# Patient Record
Sex: Female | Born: 1960 | Race: White | Hispanic: No | Marital: Married | Smoking: Never smoker
Health system: Southern US, Community
[De-identification: ages and names within clinical notes are randomized; demographics above are authoritative.]

## PROBLEM LIST (undated history)

## (undated) DIAGNOSIS — Z96659 Presence of unspecified artificial knee joint: Secondary | ICD-10-CM

## (undated) DIAGNOSIS — Z9289 Personal history of other medical treatment: Secondary | ICD-10-CM

## (undated) DIAGNOSIS — J45909 Unspecified asthma, uncomplicated: Secondary | ICD-10-CM

## (undated) DIAGNOSIS — M79605 Pain in left leg: Secondary | ICD-10-CM

## (undated) DIAGNOSIS — K219 Gastro-esophageal reflux disease without esophagitis: Secondary | ICD-10-CM

## (undated) DIAGNOSIS — M1990 Unspecified osteoarthritis, unspecified site: Secondary | ICD-10-CM

## (undated) DIAGNOSIS — E119 Type 2 diabetes mellitus without complications: Secondary | ICD-10-CM

## (undated) DIAGNOSIS — M1711 Unilateral primary osteoarthritis, right knee: Secondary | ICD-10-CM

## (undated) DIAGNOSIS — M545 Low back pain: Secondary | ICD-10-CM

## (undated) HISTORY — PX: APPENDECTOMY: SHX54

## (undated) HISTORY — PX: UPPER GI ENDOSCOPY: SHX6162

## (undated) HISTORY — PX: BREAST SURGERY: SHX581

## (undated) HISTORY — PX: COLONOSCOPY: SHX174

---

## 2004-12-31 ENCOUNTER — Ambulatory Visit: Payer: Self-pay | Admitting: Family Medicine

## 2006-11-18 ENCOUNTER — Ambulatory Visit: Payer: Self-pay | Admitting: Family Medicine

## 2008-06-12 ENCOUNTER — Ambulatory Visit: Payer: Self-pay | Admitting: Family Medicine

## 2008-06-25 ENCOUNTER — Ambulatory Visit: Payer: Self-pay | Admitting: Family Medicine

## 2009-05-24 HISTORY — PX: KNEE ARTHROSCOPY W/ MENISCECTOMY: SHX1879

## 2009-09-25 ENCOUNTER — Ambulatory Visit: Payer: Self-pay | Admitting: Family Medicine

## 2010-10-08 ENCOUNTER — Ambulatory Visit: Payer: Self-pay | Admitting: General Surgery

## 2010-10-23 ENCOUNTER — Ambulatory Visit: Payer: Self-pay | Admitting: General Surgery

## 2010-11-25 ENCOUNTER — Ambulatory Visit: Payer: Self-pay | Admitting: Gastroenterology

## 2010-11-27 LAB — PATHOLOGY REPORT

## 2011-06-09 HISTORY — PX: BREAST BIOPSY: SHX20

## 2012-06-27 ENCOUNTER — Ambulatory Visit: Payer: Self-pay | Admitting: Gastroenterology

## 2012-11-08 ENCOUNTER — Ambulatory Visit: Payer: Self-pay | Admitting: Family Medicine

## 2013-11-02 ENCOUNTER — Ambulatory Visit: Payer: Self-pay | Admitting: Family Medicine

## 2014-05-08 ENCOUNTER — Ambulatory Visit: Payer: Self-pay | Admitting: Orthopedic Surgery

## 2014-05-08 DIAGNOSIS — M545 Low back pain, unspecified: Secondary | ICD-10-CM | POA: Diagnosis present

## 2014-05-08 DIAGNOSIS — M79605 Pain in left leg: Secondary | ICD-10-CM

## 2014-05-08 HISTORY — DX: Low back pain, unspecified: M54.50

## 2014-05-08 HISTORY — DX: Pain in left leg: M79.605

## 2014-08-08 ENCOUNTER — Encounter (HOSPITAL_COMMUNITY): Payer: Self-pay | Admitting: Physician Assistant

## 2014-08-08 DIAGNOSIS — K219 Gastro-esophageal reflux disease without esophagitis: Secondary | ICD-10-CM | POA: Diagnosis present

## 2014-08-08 DIAGNOSIS — J45909 Unspecified asthma, uncomplicated: Secondary | ICD-10-CM | POA: Diagnosis present

## 2014-08-08 DIAGNOSIS — M1712 Unilateral primary osteoarthritis, left knee: Secondary | ICD-10-CM | POA: Diagnosis present

## 2014-08-08 DIAGNOSIS — M1990 Unspecified osteoarthritis, unspecified site: Secondary | ICD-10-CM | POA: Diagnosis present

## 2014-08-08 NOTE — H&P (Addendum)
TOTAL KNEE ADMISSION H&P  Patient is being admitted for left total knee arthroplasty.  Subjective:  Chief Complaint:left knee pain.  HPI: Evelyn Mcintosh, 54 y.o. female, has a history of pain and functional disability in the left knee due to arthritis and has failed non-surgical conservative treatments for greater than 12 weeks to includeNSAID's and/or analgesics, corticosteriod injections, viscosupplementation injections, flexibility and strengthening excercises, supervised PT with diminished ADL's post treatment, use of assistive devices and activity modification.  Onset of symptoms was gradual, starting 10 years ago with gradually worsening course since that time. The patient noted prior procedures on the knee to include  arthroscopy and menisectomy on the left knee(s).  Patient currently rates pain in the left knee(s) at 10 out of 10 with activity. Patient has night pain, worsening of pain with activity and weight bearing, pain that interferes with activities of daily living, crepitus and joint swelling.  Patient has evidence of subchondral sclerosis, periarticular osteophytes and joint space narrowing by imaging studies.There is no active infection.  Patient Active Problem List   Diagnosis Date Noted  . Primary localized osteoarthritis of left knee 08/08/2014  . Asthma   . GERD (gastroesophageal reflux disease)   . Arthritis    Past Medical History  Diagnosis Date  . Asthma   . GERD (gastroesophageal reflux disease)   . Arthritis     Past Surgical History  Procedure Laterality Date  . Knee arthroscopy w/ meniscectomy Left 05/24/2009     No current facility-administered medications for this encounter.  Current outpatient prescriptions:  .  omeprazole (PRILOSEC) 40 MG capsule, Take 40 mg by mouth daily., Disp: , Rfl:   Allergies no known allergies  History  Substance Use Topics  . Smoking status: Never Smoker   . Smokeless tobacco: Not on file  . Alcohol Use: No    Family  History  Problem Relation Age of Onset  . Arthritis    . Diabetes    . Hypertension Father   . Stroke Father   . Atrial fibrillation Mother      Review of Systems  Constitutional: Negative.   HENT: Negative.   Eyes: Negative.   Respiratory: Negative.   Cardiovascular: Negative.   Gastrointestinal: Negative.   Genitourinary: Negative.   Musculoskeletal: Positive for joint pain.       Left knee pain  Skin: Negative.   Neurological: Negative.   Endo/Heme/Allergies: Negative.   Psychiatric/Behavioral: Negative.     Objective:  Physical Exam  Constitutional: She is oriented to person, place, and time. She appears well-developed and well-nourished.  HENT:  Head: Normocephalic and atraumatic.  Mouth/Throat: Oropharynx is clear and moist.  Eyes: Conjunctivae and EOM are normal. Pupils are equal, round, and reactive to light.  Neck: Neck supple.  Cardiovascular: Normal rate, regular rhythm and normal heart sounds.   Respiratory: Effort normal and breath sounds normal.  GI: Soft. Bowel sounds are normal.  Genitourinary:  Not pertinent to current symptomatology therefore not examined.  Musculoskeletal:  Examination of her left knee reveals pain medially and laterally, 1+ crepitation 1+ synovitis, range of motion -5 to 115 degrees knee is stable with normal patella tracking. Exam of the right knee reveals full range of motion without pain swelling weakness or instability. Vascular exam: pulses 2+ and symmetric.  Neurological: She is alert and oriented to person, place, and time.  Skin: Skin is warm and dry.  Psychiatric: She has a normal mood and affect. Her behavior is normal.    Vital signs  in last 24 hours: Temp:  [98.2 F (36.8 C)] 98.2 F (36.8 C) (03/02 1300) Pulse Rate:  [70] 70 (03/02 1300) BP: (155)/(78) 155/78 mmHg (03/02 1300) SpO2:  [100 %] 100 % (03/02 1300) Weight:  [76.658 kg (169 lb)] 76.658 kg (169 lb) (03/02 1300)  Labs:   Estimated body mass index is  29.94 kg/(m^2) as calculated from the following:   Height as of this encounter: 5\' 3"  (1.6 m).   Weight as of this encounter: 76.658 kg (169 lb).   Imaging Review Plain radiographs demonstrate severe degenerative joint disease of the left knee(s). The overall alignment ismild valgus. The bone quality appears to be good for age and reported activity level.  Assessment/Plan:  End stage arthritis, left knee Principal Problem:   Primary localized osteoarthritis of left knee Active Problems:   Asthma   GERD (gastroesophageal reflux disease)   Arthritis   Lumbar pain with radiation down left leg    The patient history, physical examination, clinical judgment of the provider and imaging studies are consistent with end stage degenerative joint disease of the left knee(s) and total knee arthroplasty is deemed medically necessary. The treatment options including medical management, injection therapy arthroscopy and arthroplasty were discussed at length. The risks and benefits of total knee arthroplasty were presented and reviewed. The risks due to aseptic loosening, infection, stiffness, patella tracking problems, thromboembolic complications and other imponderables were discussed. The patient acknowledged the explanation, agreed to proceed with the plan and consent was signed. Patient is being admitted for inpatient treatment for surgery, pain control, PT, OT, prophylactic antibiotics, VTE prophylaxis, progressive ambulation and ADL's and discharge planning. 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  She has started her mupirocin.  Bascom Biel A. Gwinda PasseShepperson, PA-C Physician Assistant [facility] [phone]  08/08/2014, 2:51 PM

## 2014-08-09 NOTE — Pre-Procedure Instructions (Signed)
Evelyn Mcintosh  08/09/2014   Your procedure is scheduled on:  Monday, March 14th   [facility].  Call this number if you have problems the morning of surgery: [phone]   Remember:   Do not eat food or drink liquids after midnight Sunday.   Take these medicines the morning of surgery with A SIP OF WATER: Omeprazole   Do not wear jewelry, make-up or nail polish.  Do not wear lotions, powders, or perfumes. You may NOT wear deodorant the day of surgery.  Do not shave underarms & legs 48 hours prior to surgery.    Do not bring valuables to the hospital.  [facility] is not responsible for any belongings or valuables.               Contacts, dentures or bridgework may not be worn into surgery.  Leave suitcase in the car. After surgery it may be brought to your room.  For patients admitted to the hospital, discharge time is determined by your treatment team.    Name and phone number of your driver:    Special Instructions: "Preparing for Surgery" instruction sheet.   Please read over the following fact sheets that you were given: Pain Booklet, Coughing and Deep Breathing, Blood Transfusion Information, MRSA Information and Surgical Site Infection Prevention

## 2014-08-10 ENCOUNTER — Encounter (HOSPITAL_COMMUNITY)
Admission: RE | Admit: 2014-08-10 | Discharge: 2014-08-10 | Disposition: A | Discharge status: Home / Self Care | Payer: BLUE CROSS/BLUE SHIELD | Source: Ambulatory Visit | Attending: Orthopedic Surgery | Admitting: Orthopedic Surgery

## 2014-08-10 ENCOUNTER — Encounter (HOSPITAL_COMMUNITY): Payer: Self-pay

## 2014-08-10 DIAGNOSIS — J45909 Unspecified asthma, uncomplicated: Secondary | ICD-10-CM | POA: Insufficient documentation

## 2014-08-10 DIAGNOSIS — Z01818 Encounter for other preprocedural examination: Secondary | ICD-10-CM | POA: Diagnosis present

## 2014-08-10 DIAGNOSIS — Z01812 Encounter for preprocedural laboratory examination: Secondary | ICD-10-CM | POA: Diagnosis not present

## 2014-08-10 DIAGNOSIS — Z0183 Encounter for blood typing: Secondary | ICD-10-CM | POA: Diagnosis not present

## 2014-08-10 DIAGNOSIS — K219 Gastro-esophageal reflux disease without esophagitis: Secondary | ICD-10-CM | POA: Insufficient documentation

## 2014-08-10 DIAGNOSIS — M1712 Unilateral primary osteoarthritis, left knee: Secondary | ICD-10-CM | POA: Diagnosis not present

## 2014-08-10 LAB — CBC WITH DIFFERENTIAL/PLATELET
Basophils Absolute: 0 10*3/uL (ref 0.0–0.1)
Basophils Relative: 0 % (ref 0–1)
EOS ABS: 0.2 10*3/uL (ref 0.0–0.7)
EOS PCT: 5 % (ref 0–5)
HCT: 40.4 % (ref 36.0–46.0)
Hemoglobin: 13.6 g/dL (ref 12.0–15.0)
LYMPHS ABS: 1.4 10*3/uL (ref 0.7–4.0)
LYMPHS PCT: 31 % (ref 12–46)
MCH: 29.5 pg (ref 26.0–34.0)
MCHC: 33.7 g/dL (ref 30.0–36.0)
MCV: 87.6 fL (ref 78.0–100.0)
Monocytes Absolute: 0.4 10*3/uL (ref 0.1–1.0)
Monocytes Relative: 10 % (ref 3–12)
NEUTROS PCT: 54 % (ref 43–77)
Neutro Abs: 2.4 10*3/uL (ref 1.7–7.7)
PLATELETS: 346 10*3/uL (ref 150–400)
RBC: 4.61 MIL/uL (ref 3.87–5.11)
RDW: 13 % (ref 11.5–15.5)
WBC: 4.5 10*3/uL (ref 4.0–10.5)

## 2014-08-10 LAB — COMPREHENSIVE METABOLIC PANEL
ALT: 16 U/L (ref 0–35)
AST: 17 U/L (ref 0–37)
Albumin: 4 g/dL (ref 3.5–5.2)
Alkaline Phosphatase: 55 U/L (ref 39–117)
Anion gap: 7 (ref 5–15)
BUN: 12 mg/dL (ref 6–23)
CHLORIDE: 106 mmol/L (ref 96–112)
CO2: 29 mmol/L (ref 19–32)
Calcium: 9.3 mg/dL (ref 8.4–10.5)
Creatinine, Ser: 0.82 mg/dL (ref 0.50–1.10)
GFR, EST NON AFRICAN AMERICAN: 80 mL/min — AB (ref >90)
GLUCOSE: 97 mg/dL (ref 70–99)
Potassium: 4 mmol/L (ref 3.5–5.1)
Sodium: 142 mmol/L (ref 135–145)
Total Bilirubin: 0.7 mg/dL (ref 0.3–1.2)
Total Protein: 6.3 g/dL (ref 6.0–8.3)

## 2014-08-10 LAB — URINALYSIS, ROUTINE W REFLEX MICROSCOPIC
Bilirubin Urine: NEGATIVE
Glucose, UA: NEGATIVE mg/dL
HGB URINE DIPSTICK: NEGATIVE
Ketones, ur: NEGATIVE mg/dL
Leukocytes, UA: NEGATIVE
NITRITE: NEGATIVE
Protein, ur: NEGATIVE mg/dL
SPECIFIC GRAVITY, URINE: 1.019 (ref 1.005–1.030)
Urobilinogen, UA: 0.2 mg/dL (ref 0.0–1.0)
pH: 7.5 (ref 5.0–8.0)

## 2014-08-10 LAB — ABO/RH: ABO/RH(D): A NEG

## 2014-08-10 LAB — SURGICAL PCR SCREEN
MRSA, PCR: NEGATIVE
Staphylococcus aureus: POSITIVE — AB

## 2014-08-10 LAB — TYPE AND SCREEN
ABO/RH(D): A NEG
Antibody Screen: NEGATIVE

## 2014-08-10 LAB — PROTIME-INR
INR: 1.01 (ref 0.00–1.49)
Prothrombin Time: 13.4 seconds (ref 11.6–15.2)

## 2014-08-10 LAB — PREGNANCY, URINE: PREG TEST UR: NEGATIVE

## 2014-08-10 LAB — APTT: APTT: 30 s (ref 24–37)

## 2014-08-10 NOTE — Progress Notes (Signed)
Have requested Jan. 2016 EKG from Dr. Hansel FeinsteinB. Ald[place].   [phone]

## 2014-08-12 LAB — URINE CULTURE
Colony Count: NO GROWTH
Culture: NO GROWTH

## 2014-08-19 MED ORDER — CEFAZOLIN SODIUM-DEXTROSE 2-3 GM-% IV SOLR
2.0000 g | INTRAVENOUS | Status: AC
  Administered 2014-08-20: 2 g via INTRAVENOUS
  Filled 2014-08-19: qty 50

## 2014-08-19 NOTE — Anesthesia Preprocedure Evaluation (Addendum)
Anesthesia Evaluation  Patient identified by MRN, date of birth, ID band Patient awake    Reviewed: Allergy & Precautions, NPO status , Patient's Chart, lab work & pertinent test results  Airway Mallampati: II       Dental  (+) Teeth Intact, Dental Advisory Given   Pulmonary asthma ,  breath sounds clear to auscultation        Cardiovascular negative cardio ROS  Rhythm:Regular     Neuro/Psych    GI/Hepatic Neg liver ROS, GERD-  Medicated,  Endo/Other  negative endocrine ROS  Renal/GU negative Renal ROS     Musculoskeletal   Abdominal (+) + obese,   Peds  Hematology 13/40   Anesthesia Other Findings   Reproductive/Obstetrics                           Anesthesia Physical Anesthesia Plan  ASA: II  Anesthesia Plan: General   Post-op Pain Management: MAC Combined w/ Regional for Post-op pain   Induction: Intravenous  Airway Management Planned: Oral ETT  Additional Equipment:   Intra-op Plan:   Post-operative Plan: Extubation in OR  Informed Consent: I have reviewed the patients History and Physical, chart, labs and discussed the procedure including the risks, benefits and alternatives for the proposed anesthesia with the patient or authorized representative who has indicated his/her understanding and acceptance.     Plan Discussed with:   Anesthesia Plan Comments:        Anesthesia Quick Evaluation

## 2014-08-20 ENCOUNTER — Inpatient Hospital Stay (HOSPITAL_COMMUNITY): Payer: BLUE CROSS/BLUE SHIELD | Admitting: Anesthesiology

## 2014-08-20 ENCOUNTER — Inpatient Hospital Stay (HOSPITAL_COMMUNITY)
Admission: RE | Admit: 2014-08-20 | Discharge: 2014-08-21 | DRG: 470 | Disposition: A | Discharge status: Home / Self Care | Payer: BLUE CROSS/BLUE SHIELD | Source: Ambulatory Visit | Attending: Orthopedic Surgery | Admitting: Orthopedic Surgery

## 2014-08-20 ENCOUNTER — Encounter (HOSPITAL_COMMUNITY)
Admission: RE | Disposition: A | Discharge status: Home / Self Care | Payer: Self-pay | Source: Ambulatory Visit | Attending: Orthopedic Surgery

## 2014-08-20 ENCOUNTER — Encounter (HOSPITAL_COMMUNITY): Payer: Self-pay | Admitting: Anesthesiology

## 2014-08-20 DIAGNOSIS — M179 Osteoarthritis of knee, unspecified: Secondary | ICD-10-CM | POA: Diagnosis present

## 2014-08-20 DIAGNOSIS — M1712 Unilateral primary osteoarthritis, left knee: Principal | ICD-10-CM | POA: Diagnosis present

## 2014-08-20 DIAGNOSIS — J45909 Unspecified asthma, uncomplicated: Secondary | ICD-10-CM | POA: Diagnosis present

## 2014-08-20 DIAGNOSIS — M1710 Unilateral primary osteoarthritis, unspecified knee: Secondary | ICD-10-CM | POA: Diagnosis present

## 2014-08-20 DIAGNOSIS — M1990 Unspecified osteoarthritis, unspecified site: Secondary | ICD-10-CM | POA: Diagnosis present

## 2014-08-20 DIAGNOSIS — M545 Low back pain, unspecified: Secondary | ICD-10-CM | POA: Diagnosis present

## 2014-08-20 DIAGNOSIS — Z96659 Presence of unspecified artificial knee joint: Secondary | ICD-10-CM

## 2014-08-20 DIAGNOSIS — K219 Gastro-esophageal reflux disease without esophagitis: Secondary | ICD-10-CM | POA: Diagnosis present

## 2014-08-20 DIAGNOSIS — M79605 Pain in left leg: Secondary | ICD-10-CM

## 2014-08-20 DIAGNOSIS — M25562 Pain in left knee: Secondary | ICD-10-CM | POA: Diagnosis present

## 2014-08-20 HISTORY — DX: Pain in left leg: M79.605

## 2014-08-20 HISTORY — DX: Unspecified asthma, uncomplicated: J45.909

## 2014-08-20 HISTORY — DX: Low back pain: M54.5

## 2014-08-20 HISTORY — DX: Presence of unspecified artificial knee joint: Z96.659

## 2014-08-20 HISTORY — DX: Gastro-esophageal reflux disease without esophagitis: K21.9

## 2014-08-20 HISTORY — DX: Unspecified osteoarthritis, unspecified site: M19.90

## 2014-08-20 HISTORY — PX: TOTAL KNEE ARTHROPLASTY: SHX125

## 2014-08-20 SURGERY — ARTHROPLASTY, KNEE, TOTAL
Anesthesia: General | Laterality: Left

## 2014-08-20 MED ORDER — LIDOCAINE HCL (CARDIAC) 20 MG/ML IV SOLN
INTRAVENOUS | Status: AC
  Filled 2014-08-20: qty 5

## 2014-08-20 MED ORDER — DEXAMETHASONE SODIUM PHOSPHATE 10 MG/ML IJ SOLN
10.0000 mg | Freq: Three times a day (TID) | INTRAMUSCULAR | Status: DC
  Administered 2014-08-20 – 2014-08-21 (×3): 10 mg via INTRAVENOUS
  Filled 2014-08-20 (×3): qty 1

## 2014-08-20 MED ORDER — CEFUROXIME SODIUM 1.5 G IJ SOLR
INTRAMUSCULAR | Status: AC
  Filled 2014-08-20: qty 1.5

## 2014-08-20 MED ORDER — FENTANYL CITRATE 0.05 MG/ML IJ SOLN
INTRAMUSCULAR | Status: AC
  Filled 2014-08-20: qty 5

## 2014-08-20 MED ORDER — POTASSIUM CHLORIDE IN NACL 20-0.9 MEQ/L-% IV SOLN
INTRAVENOUS | Status: DC
Start: 2014-08-20 — End: 2014-08-21
  Administered 2014-08-20: 100 mL/h via INTRAVENOUS
  Administered 2014-08-20: 15:00:00 via INTRAVENOUS
  Filled 2014-08-20 (×6): qty 1000

## 2014-08-20 MED ORDER — MIDAZOLAM HCL 2 MG/2ML IJ SOLN
INTRAMUSCULAR | Status: AC
  Filled 2014-08-20: qty 2

## 2014-08-20 MED ORDER — 0.9 % SODIUM CHLORIDE (POUR BTL) OPTIME
TOPICAL | Status: DC | PRN
  Administered 2014-08-20: 1000 mL

## 2014-08-20 MED ORDER — ROCURONIUM BROMIDE 100 MG/10ML IV SOLN
INTRAVENOUS | Status: DC | PRN
  Administered 2014-08-20: 40 mg via INTRAVENOUS

## 2014-08-20 MED ORDER — CEFUROXIME SODIUM 1.5 G IJ SOLR
INTRAMUSCULAR | Status: DC | PRN
  Administered 2014-08-20: 1.5 g

## 2014-08-20 MED ORDER — BUPIVACAINE-EPINEPHRINE (PF) 0.25% -1:200000 IJ SOLN
INTRAMUSCULAR | Status: AC
  Filled 2014-08-20: qty 30

## 2014-08-20 MED ORDER — PHENOL 1.4 % MT LIQD: 1.0000 | OROMUCOSAL | Status: DC | PRN

## 2014-08-20 MED ORDER — SODIUM CHLORIDE 0.9 % IJ SOLN
INTRAMUSCULAR | Status: AC
  Filled 2014-08-20: qty 10

## 2014-08-20 MED ORDER — MIDAZOLAM HCL 5 MG/5ML IJ SOLN
INTRAMUSCULAR | Status: DC | PRN
  Administered 2014-08-20: 2 mg via INTRAVENOUS

## 2014-08-20 MED ORDER — HYDROMORPHONE HCL 1 MG/ML IJ SOLN
0.5000 mg | INTRAMUSCULAR | Status: AC | PRN
  Administered 2014-08-20 (×4): 0.5 mg via INTRAVENOUS

## 2014-08-20 MED ORDER — PROPOFOL 10 MG/ML IV BOLUS
INTRAVENOUS | Status: DC | PRN
  Administered 2014-08-20: 150 mg via INTRAVENOUS

## 2014-08-20 MED ORDER — MENTHOL 3 MG MT LOZG: 1.0000 | LOZENGE | OROMUCOSAL | Status: DC | PRN

## 2014-08-20 MED ORDER — LACTATED RINGERS IV SOLN
INTRAVENOUS | Status: DC
  Administered 2014-08-20 (×2): via INTRAVENOUS

## 2014-08-20 MED ORDER — OXYCODONE HCL 5 MG PO TABS
5.0000 mg | ORAL_TABLET | ORAL | Status: DC | PRN
  Administered 2014-08-20 – 2014-08-21 (×9): 10 mg via ORAL
  Filled 2014-08-20 (×9): qty 2

## 2014-08-20 MED ORDER — FENTANYL CITRATE 0.05 MG/ML IJ SOLN
INTRAMUSCULAR | Status: DC | PRN
  Administered 2014-08-20 (×4): 50 ug via INTRAVENOUS
  Administered 2014-08-20: 100 ug via INTRAVENOUS
  Administered 2014-08-20: 50 ug via INTRAVENOUS

## 2014-08-20 MED ORDER — GLYCOPYRROLATE 0.2 MG/ML IJ SOLN
INTRAMUSCULAR | Status: AC
  Filled 2014-08-20: qty 2

## 2014-08-20 MED ORDER — ROCURONIUM BROMIDE 50 MG/5ML IV SOLN
INTRAVENOUS | Status: AC
  Filled 2014-08-20: qty 1

## 2014-08-20 MED ORDER — PROPOFOL 10 MG/ML IV BOLUS
INTRAVENOUS | Status: AC
  Filled 2014-08-20: qty 20

## 2014-08-20 MED ORDER — DIPHENHYDRAMINE HCL 12.5 MG/5ML PO ELIX
12.5000 mg | ORAL_SOLUTION | ORAL | Status: DC | PRN
Start: 2014-08-20 — End: 2014-08-21

## 2014-08-20 MED ORDER — ONDANSETRON HCL 4 MG/2ML IJ SOLN
INTRAMUSCULAR | Status: DC | PRN
  Administered 2014-08-20: 4 mg via INTRAVENOUS

## 2014-08-20 MED ORDER — APIXABAN 2.5 MG PO TABS
2.5000 mg | ORAL_TABLET | Freq: Two times a day (BID) | ORAL | Status: DC
  Administered 2014-08-21: 2.5 mg via ORAL
  Filled 2014-08-20 (×2): qty 1

## 2014-08-20 MED ORDER — ACETAMINOPHEN 650 MG RE SUPP
650.0000 mg | Freq: Four times a day (QID) | RECTAL | Status: DC | PRN
Start: 2014-08-20 — End: 2014-08-21

## 2014-08-20 MED ORDER — BUPIVACAINE-EPINEPHRINE 0.25% -1:200000 IJ SOLN
INTRAMUSCULAR | Status: DC | PRN
  Administered 2014-08-20: 30 mL

## 2014-08-20 MED ORDER — POLYETHYLENE GLYCOL 3350 17 G PO PACK
17.0000 g | PACK | Freq: Two times a day (BID) | ORAL | Status: DC
  Administered 2014-08-20 – 2014-08-21 (×2): 17 g via ORAL
  Filled 2014-08-20 (×2): qty 1

## 2014-08-20 MED ORDER — CHLORHEXIDINE GLUCONATE 4 % EX LIQD
60.0000 mL | Freq: Once | CUTANEOUS | Status: DC
  Filled 2014-08-20: qty 60

## 2014-08-20 MED ORDER — NEOSTIGMINE METHYLSULFATE 10 MG/10ML IV SOLN
INTRAVENOUS | Status: DC | PRN
  Administered 2014-08-20: 3 mg via INTRAVENOUS

## 2014-08-20 MED ORDER — ACETAMINOPHEN 10 MG/ML IV SOLN
INTRAVENOUS | Status: DC | PRN
  Administered 2014-08-20: 1000 mg via INTRAVENOUS

## 2014-08-20 MED ORDER — SUCCINYLCHOLINE CHLORIDE 20 MG/ML IJ SOLN
INTRAMUSCULAR | Status: AC
  Filled 2014-08-20: qty 1

## 2014-08-20 MED ORDER — DOCUSATE SODIUM 100 MG PO CAPS
100.0000 mg | ORAL_CAPSULE | Freq: Two times a day (BID) | ORAL | Status: DC
  Administered 2014-08-20 – 2014-08-21 (×2): 100 mg via ORAL
  Filled 2014-08-20 (×2): qty 1

## 2014-08-20 MED ORDER — PANTOPRAZOLE SODIUM 40 MG PO TBEC
40.0000 mg | DELAYED_RELEASE_TABLET | Freq: Every day | ORAL | Status: DC
  Administered 2014-08-21: 40 mg via ORAL
  Filled 2014-08-20: qty 1

## 2014-08-20 MED ORDER — ACETAMINOPHEN 325 MG PO TABS: 650.0000 mg | ORAL_TABLET | Freq: Four times a day (QID) | ORAL | Status: DC | PRN

## 2014-08-20 MED ORDER — FENTANYL CITRATE 0.05 MG/ML IJ SOLN
INTRAMUSCULAR | Status: AC
  Filled 2014-08-20: qty 2

## 2014-08-20 MED ORDER — ONDANSETRON HCL 4 MG/2ML IJ SOLN: 4.0000 mg | Freq: Four times a day (QID) | INTRAMUSCULAR | Status: DC | PRN

## 2014-08-20 MED ORDER — HYDROMORPHONE HCL 1 MG/ML IJ SOLN
INTRAMUSCULAR | Status: AC
  Filled 2014-08-20: qty 1

## 2014-08-20 MED ORDER — ALUM & MAG HYDROXIDE-SIMETH 200-200-20 MG/5ML PO SUSP: 30.0000 mL | ORAL | Status: DC | PRN

## 2014-08-20 MED ORDER — SODIUM CHLORIDE 0.9 % IR SOLN
Status: DC | PRN
  Administered 2014-08-20 (×3): 1000 mL

## 2014-08-20 MED ORDER — MEPERIDINE HCL 25 MG/ML IJ SOLN: 6.2500 mg | INTRAMUSCULAR | Status: DC | PRN

## 2014-08-20 MED ORDER — DEXAMETHASONE SODIUM PHOSPHATE 10 MG/ML IJ SOLN
INTRAMUSCULAR | Status: AC
  Filled 2014-08-20: qty 1

## 2014-08-20 MED ORDER — ONDANSETRON HCL 4 MG/2ML IJ SOLN
INTRAMUSCULAR | Status: AC
  Filled 2014-08-20: qty 2

## 2014-08-20 MED ORDER — DEXAMETHASONE SODIUM PHOSPHATE 10 MG/ML IJ SOLN
10.0000 mg | Freq: Once | INTRAMUSCULAR | Status: AC
  Administered 2014-08-20: 10 mg via INTRAVENOUS

## 2014-08-20 MED ORDER — LIDOCAINE HCL (CARDIAC) 20 MG/ML IV SOLN
INTRAVENOUS | Status: DC | PRN
  Administered 2014-08-20: 100 mg via INTRAVENOUS

## 2014-08-20 MED ORDER — FENTANYL CITRATE 0.05 MG/ML IJ SOLN
25.0000 ug | INTRAMUSCULAR | Status: DC | PRN
  Administered 2014-08-20 (×2): 50 ug via INTRAVENOUS

## 2014-08-20 MED ORDER — CELECOXIB 200 MG PO CAPS
200.0000 mg | ORAL_CAPSULE | Freq: Two times a day (BID) | ORAL | Status: DC
Start: 2014-08-20 — End: 2014-08-21
  Administered 2014-08-20 – 2014-08-21 (×2): 200 mg via ORAL
  Filled 2014-08-20 (×2): qty 1

## 2014-08-20 MED ORDER — METOCLOPRAMIDE HCL 5 MG/ML IJ SOLN: 5.0000 mg | Freq: Three times a day (TID) | INTRAMUSCULAR | Status: DC | PRN

## 2014-08-20 MED ORDER — PROMETHAZINE HCL 25 MG/ML IJ SOLN: 6.2500 mg | INTRAMUSCULAR | Status: DC | PRN

## 2014-08-20 MED ORDER — EPHEDRINE SULFATE 50 MG/ML IJ SOLN
INTRAMUSCULAR | Status: AC
  Filled 2014-08-20: qty 1

## 2014-08-20 MED ORDER — POVIDONE-IODINE 7.5 % EX SOLN
Freq: Once | CUTANEOUS | Status: DC
  Filled 2014-08-20: qty 118

## 2014-08-20 MED ORDER — HYDROMORPHONE HCL 1 MG/ML IJ SOLN
1.0000 mg | INTRAMUSCULAR | Status: DC | PRN
  Administered 2014-08-20 – 2014-08-21 (×5): 1 mg via INTRAVENOUS
  Filled 2014-08-20 (×5): qty 1

## 2014-08-20 MED ORDER — GLYCOPYRROLATE 0.2 MG/ML IJ SOLN
INTRAMUSCULAR | Status: DC | PRN
  Administered 2014-08-20: 0.4 mg via INTRAVENOUS

## 2014-08-20 MED ORDER — BUPIVACAINE-EPINEPHRINE (PF) 0.5% -1:200000 IJ SOLN
INTRAMUSCULAR | Status: DC | PRN
  Administered 2014-08-20: 30 mL via PERINEURAL

## 2014-08-20 MED ORDER — ACETAMINOPHEN 10 MG/ML IV SOLN
INTRAVENOUS | Status: AC
  Filled 2014-08-20: qty 100

## 2014-08-20 MED ORDER — ONDANSETRON HCL 4 MG PO TABS: 4.0000 mg | ORAL_TABLET | Freq: Four times a day (QID) | ORAL | Status: DC | PRN

## 2014-08-20 MED ORDER — METOCLOPRAMIDE HCL 5 MG PO TABS
5.0000 mg | ORAL_TABLET | Freq: Three times a day (TID) | ORAL | Status: DC | PRN
  Filled 2014-08-20: qty 2

## 2014-08-20 SURGICAL SUPPLY — 74 items
BANDAGE ESMARK 6X9 LF (GAUZE/BANDAGES/DRESSINGS) ×1 IMPLANT
BENZOIN TINCTURE PRP APPL 2/3 (GAUZE/BANDAGES/DRESSINGS) ×3 IMPLANT
BLADE SAGITTAL 25.0X1.19X90 (BLADE) ×2 IMPLANT
BLADE SAGITTAL 25.0X1.19X90MM (BLADE) ×1
BLADE SAW RECIP 87.9 MT (BLADE) ×3 IMPLANT
BLADE SAW SGTL 11.0X1.19X90.0M (BLADE) IMPLANT
BLADE SAW SGTL 13.0X1.19X90.0M (BLADE) ×3 IMPLANT
BLADE SURG 10 STRL SS (BLADE) ×6 IMPLANT
BNDG ELASTIC 6X15 VLCR STRL LF (GAUZE/BANDAGES/DRESSINGS) ×3 IMPLANT
BNDG ESMARK 6X9 LF (GAUZE/BANDAGES/DRESSINGS) ×3
BOWL SMART MIX CTS (DISPOSABLE) ×3 IMPLANT
CAPT KNEE TOTAL 3 ATTUNE ×3 IMPLANT
CEMENT HV SMART SET (Cement) ×6 IMPLANT
CLOSURE STERI-STRIP 1/2X4 (GAUZE/BANDAGES/DRESSINGS) ×1
CLOSURE WOUND 1/2 X4 (GAUZE/BANDAGES/DRESSINGS) ×1
CLSR STERI-STRIP ANTIMIC 1/2X4 (GAUZE/BANDAGES/DRESSINGS) ×2 IMPLANT
COVER SURGICAL LIGHT HANDLE (MISCELLANEOUS) ×3 IMPLANT
CUFF TOURNIQUET SINGLE 34IN LL (TOURNIQUET CUFF) ×3 IMPLANT
CUFF TOURNIQUET SINGLE 44IN (TOURNIQUET CUFF) IMPLANT
DRAPE EXTREMITY T 121X128X90 (DRAPE) ×3 IMPLANT
DRAPE IMP U-DRAPE 54X76 (DRAPES) ×3 IMPLANT
DRAPE INCISE IOBAN 66X45 STRL (DRAPES) ×3 IMPLANT
DRAPE PROXIMA HALF (DRAPES) ×3 IMPLANT
DRAPE U-SHAPE 47X51 STRL (DRAPES) ×3 IMPLANT
DRSG AQUACEL AG ADV 3.5X14 (GAUZE/BANDAGES/DRESSINGS) ×3 IMPLANT
DRSG PAD ABDOMINAL 8X10 ST (GAUZE/BANDAGES/DRESSINGS) ×3 IMPLANT
DURAPREP 26ML APPLICATOR (WOUND CARE) ×6 IMPLANT
ELECT CAUTERY BLADE 6.4 (BLADE) ×3 IMPLANT
ELECT REM PT RETURN 9FT ADLT (ELECTROSURGICAL) ×3
ELECTRODE REM PT RTRN 9FT ADLT (ELECTROSURGICAL) ×1 IMPLANT
EVACUATOR 1/8 PVC DRAIN (DRAIN) ×3 IMPLANT
FACESHIELD WRAPAROUND (MASK) ×3 IMPLANT
GAUZE SPONGE 4X4 12PLY STRL (GAUZE/BANDAGES/DRESSINGS) ×3 IMPLANT
GLOVE BIO SURGEON STRL SZ 6.5 (GLOVE) ×6 IMPLANT
GLOVE BIO SURGEON STRL SZ7 (GLOVE) ×3 IMPLANT
GLOVE BIO SURGEONS STRL SZ 6.5 (GLOVE) ×3
GLOVE BIOGEL PI IND STRL 7.0 (GLOVE) ×3 IMPLANT
GLOVE BIOGEL PI IND STRL 7.5 (GLOVE) ×1 IMPLANT
GLOVE BIOGEL PI INDICATOR 7.0 (GLOVE) ×6
GLOVE BIOGEL PI INDICATOR 7.5 (GLOVE) ×2
GLOVE ECLIPSE 7.5 STRL STRAW (GLOVE) ×3 IMPLANT
GLOVE SS BIOGEL STRL SZ 7.5 (GLOVE) ×1 IMPLANT
GLOVE SUPERSENSE BIOGEL SZ 7.5 (GLOVE) ×2
GOWN STRL REUS W/ TWL LRG LVL3 (GOWN DISPOSABLE) ×3 IMPLANT
GOWN STRL REUS W/ TWL XL LVL3 (GOWN DISPOSABLE) ×2 IMPLANT
GOWN STRL REUS W/TWL LRG LVL3 (GOWN DISPOSABLE) ×6
GOWN STRL REUS W/TWL XL LVL3 (GOWN DISPOSABLE) ×4
HANDPIECE INTERPULSE COAX TIP (DISPOSABLE) ×2
HOOD PEEL AWAY FACE SHEILD DIS (HOOD) ×9 IMPLANT
IMMOBILIZER KNEE 22 UNIV (SOFTGOODS) IMPLANT
KIT BASIN OR (CUSTOM PROCEDURE TRAY) ×3 IMPLANT
KIT ROOM TURNOVER OR (KITS) ×3 IMPLANT
MANIFOLD NEPTUNE II (INSTRUMENTS) ×3 IMPLANT
MARKER SKIN DUAL TIP RULER LAB (MISCELLANEOUS) ×3 IMPLANT
NS IRRIG 1000ML POUR BTL (IV SOLUTION) ×3 IMPLANT
PACK TOTAL JOINT (CUSTOM PROCEDURE TRAY) ×3 IMPLANT
PACK UNIVERSAL I (CUSTOM PROCEDURE TRAY) ×3 IMPLANT
PAD ARMBOARD 7.5X6 YLW CONV (MISCELLANEOUS) ×6 IMPLANT
PADDING CAST COTTON 6X4 STRL (CAST SUPPLIES) ×3 IMPLANT
RUBBERBAND STERILE (MISCELLANEOUS) ×3 IMPLANT
SET HNDPC FAN SPRY TIP SCT (DISPOSABLE) ×1 IMPLANT
STRIP CLOSURE SKIN 1/2X4 (GAUZE/BANDAGES/DRESSINGS) ×2 IMPLANT
SUCTION FRAZIER TIP 10 FR DISP (SUCTIONS) ×3 IMPLANT
SUT ETHIBOND NAB CT1 #1 30IN (SUTURE) ×6 IMPLANT
SUT MNCRL AB 3-0 PS2 18 (SUTURE) ×3 IMPLANT
SUT VIC AB 0 CT1 27 (SUTURE) ×4
SUT VIC AB 0 CT1 27XBRD ANBCTR (SUTURE) ×2 IMPLANT
SUT VIC AB 2-0 CT1 27 (SUTURE) ×4
SUT VIC AB 2-0 CT1 TAPERPNT 27 (SUTURE) ×2 IMPLANT
SYR 30ML SLIP (SYRINGE) ×3 IMPLANT
TOWEL OR 17X24 6PK STRL BLUE (TOWEL DISPOSABLE) ×3 IMPLANT
TOWEL OR 17X26 10 PK STRL BLUE (TOWEL DISPOSABLE) ×3 IMPLANT
TRAY FOLEY CATH 16FR SILVER (SET/KITS/TRAYS/PACK) ×3 IMPLANT
WATER STERILE IRR 1000ML POUR (IV SOLUTION) IMPLANT

## 2014-08-20 NOTE — Progress Notes (Signed)
Orthopedic Tech Progress Note Patient Details:  Evelyn Mcintosh [birth year] [id number] On cpm at 8:45 pm Patient ID: Evelyn Mcintosh, female   DOB: [birth year], 54 y.o.   MRN: [id number]   [name], [name] 08/20/2014, 8:45 PM

## 2014-08-20 NOTE — Interval H&P Note (Signed)
History and Physical Interval Note:  08/20/2014 7:05 AM  Evelyn Mcintosh  has presented today for surgery, with the diagnosis of primary localized OA left knee  The various methods of treatment have been discussed with the patient and family. After consideration of risks, benefits and other options for treatment, the patient has consented to  Procedure(s): TOTAL KNEE ARTHROPLASTY (Left) as a surgical intervention .  The patient's history has been reviewed, patient examined, no change in status, stable for surgery.  I have reviewed the patient's chart and labs.  Questions were answered to the patient's satisfaction.     Salvatore MarvelWAINER,Jabreel Chimento A

## 2014-08-20 NOTE — Evaluation (Signed)
Physical Therapy Evaluation Patient Details Name: Evelyn Mcintosh MRN: [id number] DOB: 1960/11/13 Today's Date: 08/20/2014   History of Present Illness  [name], 54 y.o. female, has a history of pain and functional disability in the left knee due to arthritis and has failed non-surgical conservative treatments for greater than 12 weeks to includeNSAID's and/or analgesics, corticosteriod injections, viscosupplementation injections, flexibility and strengthening excercises, supervised PT with diminished ADL's post treatment, use of assistive devices and activity modification. Onset of symptoms was gradual, starting 10 years ago with gradually worsening course since that time.  Pt s/p L TKA on 3/14.   Clinical Impression  Pt is s/p TKA resulting in the deficits listed below (see PT Problem List). Pt tolerated out of bed mobility and was able to ambulate 50 feet.  Pt will benefit from skilled PT to increase their independence and safety with mobility to allow discharge to the venue listed below.     Follow Up Recommendations Home health PT;Supervision - Intermittent    Equipment Recommendations  Rolling walker with 5" wheels    Recommendations for Other Services       Precautions / Restrictions Precautions Precautions: Knee Precaution Booklet Issued: No Precaution Comments: Went over precautions verbally with patient, including no resting knee flexion. Required Braces or Orthoses: Knee Immobilizer - Left Knee Immobilizer - Left: On when out of bed or walking Restrictions Weight Bearing Restrictions: Yes LLE Weight Bearing: Weight bearing as tolerated Other Position/Activity Restrictions: WBAT      Mobility  Bed Mobility Overal bed mobility: Modified Independent             General bed mobility comments: Able to navigate LLE in order to move supine to sit  Transfers Overall transfer level: Needs assistance Equipment used: Rolling walker (2 wheeled) Transfers: Sit  to/from Stand Sit to Stand: Min guard         General transfer comment: Pt required v/c for proper hand placement for sit to stand with walker  Ambulation/Gait Ambulation/Gait assistance: Min guard Ambulation Distance (Feet): 50 Feet Assistive device: Rolling walker (2 wheeled) Gait Pattern/deviations: Step-to pattern;Decreased stride length;Decreased weight shift to right Gait velocity: Slow   General Gait Details: Able to navigate well with rolling walker; moderate pressure through UE. v/c's to achieve L knee extension to prevent buckling  Stairs            Wheelchair Mobility    Modified Rankin (Stroke Patients Only)       Balance Overall balance assessment: Needs assistance         Standing balance support: Bilateral upper extremity supported Standing balance-Leahy Scale: Fair Standing balance comment: Able to WB on LLE as tolerable                             Pertinent Vitals/Pain Pain Assessment: 0-10 Pain Score: 4  Pain Location: L knee  Pain Descriptors / Indicators: Aching Pain Intervention(s): Monitored during session    Home Living Family/patient expects to be discharged to:: Private residence Living Arrangements: Spouse/significant other Available Help at Discharge: Family Type of Home: House Home Access: Stairs to enter Entrance Stairs-Rails: Left Entrance Stairs-Number of Steps: 4 Home Layout: One level Home Equipment: None Additional Comments: Family seems very active in recovery    Prior Function Level of Independence: Independent               Hand Dominance        Extremity/Trunk Assessment  Upper Extremity Assessment: Overall WFL for tasks assessed           Lower Extremity Assessment: LLE deficits/detail   LLE Deficits / Details: able to initiate active knee flexion and quad set  Cervical / Trunk Assessment: Normal  Communication   Communication: No difficulties  Cognition Arousal/Alertness:  Awake/alert Behavior During Therapy: WFL for tasks assessed/performed Overall Cognitive Status: Within Functional Limits for tasks assessed                      General Comments      Exercises Total Joint Exercises Ankle Circles/Pumps: 10 reps;AROM;Right;Left;Seated (HEP) Quad Sets: AROM;Left;5 reps (Instruction in HEP) Heel Slides: AROM (Instructed in HEP) Goniometric ROM: 40 degrees flexion      Assessment/Plan    PT Assessment Patient needs continued PT services  PT Diagnosis Difficulty walking;Abnormality of gait;Generalized weakness;Acute pain   PT Problem List Decreased strength;Decreased range of motion;Decreased activity tolerance;Decreased balance;Decreased mobility;Decreased coordination;Decreased knowledge of use of DME  PT Treatment Interventions DME instruction;Gait training;Stair training;Functional mobility training;Therapeutic activities;Balance training;Neuromuscular re-education;Patient/family education   PT Goals (Current goals can be found in the Care Plan section) Acute Rehab PT Goals Patient Stated Goal: Return home PT Goal Formulation: With patient/family Time For Goal Achievement: 09/03/14 Potential to Achieve Goals: Good    Frequency 7X/week   Barriers to discharge        Co-evaluation               End of Session Equipment Utilized During Treatment: Gait belt Activity Tolerance: Patient tolerated treatment well Patient left: in chair;with call bell/phone within reach Nurse Communication: Mobility status         Time: [id number] PT Time Calculation (min) (ACUTE ONLY): 34 min   Charges:   PT Evaluation $Initial PT Evaluation Tier I: 1 Procedure PT Treatments $Gait Training: 8-22 mins   PT G Codes:        Viann Nielson 08/20/2014, 5:03 PM  Vella RaringKailee Grant Swager, SPT (student physical therapist) Office phone: [phone]

## 2014-08-20 NOTE — Op Note (Signed)
MRN:     [id number] DOB/AGE:    06-15-1960 / 53 y.o.       OPERATIVE REPORT    DATE OF PROCEDURE:  08/20/2014       PREOPERATIVE DIAGNOSIS:   Primary localized OA left knee      Estimated body mass index is 29.94 kg/(m^2) as calculated from the following:   Height as of this encounter:  (1.6 m).   Weight as of this encounter: 76.658 kg (169 lb).                                                        POSTOPERATIVE DIAGNOSIS:   same                                                                      PROCEDURE:  Procedure(s): TOTAL KNEE ARTHROPLASTY Using Depuy Attune RP implants #5 Femur, #4Tibia, 5mm sigma RP bearing, 29 Patella     SURGEON: Teira Arcilla A    ASSISTANT:  Kirstin Shepperson PA-C   (Present and scrubbed throughout the case, critical for assistance with exposure, retraction, instrumentation, and closure.)         ANESTHESIA: GET with Femoral Nerve Block  DRAINS: foley, 2 medium hemovac in knee   TOURNIQUET TIME:   COMPLICATIONS:  None     SPECIMENS: None   INDICATIONS FOR PROCEDURE: The patient has  djd left knee, varus deformities, XR shows bone on bone arthritis. Patient has failed all conservative measures including anti-inflammatory medicines, narcotics, attempts at  exercise and weight loss, cortisone injections and viscosupplementation.  Risks and benefits of surgery have been discussed, questions answered.   DESCRIPTION OF PROCEDURE: The patient identified by armband, received  right femoral nerve block and IV antibiotics, in the holding area at [facility]. Patient taken to the operating room, appropriate anesthetic  monitors were attached General endotracheal anesthesia induced with  the patient in supine position, Foley catheter was inserted. Tourniquet  applied high to the operative thigh. Lateral post and foot positioner  applied to the table, the lower extremity was then prepped and draped  in usual sterile fashion from the ankle to  the tourniquet. Time-out procedure was performed. The limb was wrapped with an Esmarch bandage and the tourniquet inflated to 365 mmHg. We began the operation by making the anterior midline incision starting at handbreadth above the patella going over the patella 1 cm medial to and  4 cm distal to the tibial tubercle. Small bleeders in the skin and the  subcutaneous tissue identified and cauterized. Transverse retinaculum was incised and reflected medially and a medial parapatellar arthrotomy was accomplished. the patella was everted and theprepatellar fat pad resected. The superficial medial collateral  ligament was then elevated from anterior to posterior along the proximal  flare of the tibia and anterior half of the menisci resected. The knee was hyperflexed exposing bone on bone arthritis. Peripheral and notch osteophytes as well as the cruciate ligaments were then resected. We continued to  work our way around posteriorly along the proximal tibia, and externally  rotated the tibia subluxing it out from underneath the femur. A McHale  retractor was placed through the notch and a lateral Hohmann retractor  placed, and we then drilled through the proximal tibia in line with the  axis of the tibia followed by an intramedullary guide rod and 2-degree  posterior slope cutting guide. The tibial cutting guide was pinned into place  allowing resection of 4 mm of bone medially and about 6 mm of bone  laterally because of her varus deformity. Satisfied with the tibial resection, we then  entered the distal femur 2 mm anterior to the PCL origin with the  intramedullary guide rod and applied the distal femoral cutting guide  set at 11mm, with 5 degrees of valgus. This was pinned along the  epicondylar axis. At this point, the distal femoral cut was accomplished without difficulty. We then sized for a #5 femoral component and pinned the guide in 3 degrees of external rotation.The chamfer cutting guide was  pinned into place. The anterior, posterior, and chamfer cuts were accomplished without difficulty followed by  the Sigma RP box cutting guide and the box cut. We also removed posterior osteophytes from the posterior femoral condyles. At this  time, the knee was brought into full extension. We checked our  extension and flexion gaps and found them symmetric at 5mm.  The patella thickness measured at 21 mm. We set the cutting guide at 13 and removed the posterior 7.5 mm  of the patella sized for 29 button and drilled the lollipop. The knee  was then once again hyperflexed exposing the proximal tibia. We sized for a #4 tibial base plate, applied the smokestack and the conical reamer followed by the the Delta fin keel punch. We then hammered into place the Sigma RP trial femoral component, inserted a 5-mm trial bearing, trial patellar button, and took the knee through range of motion from 0-130 degrees. No thumb pressure was required for patellar  tracking. At this point, all trial components were removed, a double batch of DePuy HV cement with 1500 mg of Zinacef was mixed and applied to all bony metallic mating surfaces except for the posterior condyles of the femur itself. In order, we  hammered into place the tibial tray and removed excess cement, the femoral component and removed excess cement, a 5-mm Sigma RP bearing  was inserted, and the knee brought to full extension with compression.  The patellar button was clamped into place, and excess cement  removed. While the cement cured the wound was irrigated out with normal saline solution pulse lavage, and medium Hemovac drains were placed.. Ligament stability and patellar tracking were checked and found to be excellent. The tourniquet was then released and hemostasis was obtained with cautery. The parapatellar arthrotomy was closed with  #1 ethibond suture. The subcutaneous tissue with 0 and 2-0 undyed  Vicryl suture, and 4-0 Monocryl.. A dressing of  Xeroform,  4 x 4, dressing sponges, Webril, and Ace wrap applied. Needle and sponge count were correct times 2.The patient awakened, extubated, and taken to recovery room without difficulty. Vascular status was normal, pulses 2+ and symmetric.   Analyah Mcconnon A 08/20/2014, 8:59 AM

## 2014-08-20 NOTE — Anesthesia Postprocedure Evaluation (Signed)
  Anesthesia Post-op Note  Patient: Evelyn Mcintosh  Procedure(s) Performed: Procedure(s): TOTAL KNEE ARTHROPLASTY (Left)  Patient Location: PACU  Anesthesia Type:General  Level of Consciousness: awake, alert  and oriented  Airway and Oxygen Therapy: Patient Spontanous Breathing and Patient connected to nasal cannula oxygen  Post-op Pain: moderate  Post-op Assessment: Post-op Vital signs reviewed, Patient's Cardiovascular Status Stable, Respiratory Function Stable and Patent Airway  Post-op Vital Signs: Reviewed and stable  Last Vitals:  Filed Vitals:   08/20/14 0951  BP: 170/89  Pulse: 97  Temp:   Resp: 20    Complications: No apparent anesthesia complications

## 2014-08-20 NOTE — Progress Notes (Signed)
Orthopedic Tech Progress Note Patient Details:  Evelyn Mcintosh Jan 10, 1961 [id number]  CPM Left Knee CPM Left Knee: On Left Knee Flexion (Degrees): 90 Left Knee Extension (Degrees): 0 Additional Comments: trapeze bar patient helper Viewed order from doctor's order list  Nikki DomCrawford, Errin Whitelaw 08/20/2014, 10:01 AM

## 2014-08-20 NOTE — Transfer of Care (Signed)
Immediate Anesthesia Transfer of Care Note  Patient: Evelyn Mcintosh  Procedure(s) Performed: Procedure(s): TOTAL KNEE ARTHROPLASTY (Left)  Patient Location: PACU  Anesthesia Type:General  Level of Consciousness: awake, alert  and oriented  Airway & Oxygen Therapy: Patient Spontanous Breathing and Patient connected to nasal cannula oxygen  Post-op Assessment: Report given to RN and Post -op Vital signs reviewed and stable  Post vital signs: Reviewed and stable  Last Vitals:  Filed Vitals:   08/20/14 0624  BP: 155/70  Pulse: 73  Temp: 36.3 C    Complications: No apparent anesthesia complications

## 2014-08-20 NOTE — Anesthesia Procedure Notes (Addendum)
Anesthesia Regional Block:  Adductor canal block  Pre-Anesthetic Checklist: ,, timeout performed, Correct Patient, Correct Site, Correct Laterality, Correct Procedure, Correct Position, site marked, Risks and benefits discussed,  Surgical consent,  Pre-op evaluation,  At surgeon's request and post-op pain management  Laterality: Lower and Left  Prep: Maximum Sterile Barrier Precautions used and chloraprep       Needles:  Injection technique: Single-shot  Needle Type: Echogenic Stimulator Needle     Needle Length: 10cm 10 cm Needle Gauge: 21 and 21 G    Additional Needles:  Procedures: ultrasound guided (picture in chart) Adductor canal block Narrative:  Start time: 08/20/2014 7:00 AM End time: 08/20/2014 7:10 AM Anesthesiologist: Sebastian AcheMANNY, THEODORE  Additional Notes: L AC Block  30ml .5% marcaine with epi, talked to patient throughout   Procedure Name: Intubation Date/Time: 08/20/2014 7:23 AM Performed by: Sharlene DoryWALKER, Max Romano E Pre-anesthesia Checklist: Patient identified, Emergency Drugs available, Suction available, Patient being monitored and Timeout performed Patient Re-evaluated:Patient Re-evaluated prior to inductionOxygen Delivery Method: Circle system utilized Preoxygenation: Pre-oxygenation with 100% oxygen Intubation Type: IV induction Ventilation: Mask ventilation without difficulty Laryngoscope Size: Mac and 3 Grade View: Grade I Tube type: Oral Tube size: 7.0 mm Number of attempts: 1 Airway Equipment and Method: Stylet Placement Confirmation: ETT inserted through vocal cords under direct vision,  positive ETCO2 and breath sounds checked- equal and bilateral Secured at: 21 cm Tube secured with: Tape Dental Injury: Teeth and Oropharynx as per pre-operative assessment

## 2014-08-21 ENCOUNTER — Encounter (HOSPITAL_COMMUNITY): Payer: Self-pay | Admitting: *Deleted

## 2014-08-21 LAB — BASIC METABOLIC PANEL
Anion gap: 5 (ref 5–15)
BUN: 8 mg/dL (ref 6–23)
CHLORIDE: 107 mmol/L (ref 96–112)
CO2: 27 mmol/L (ref 19–32)
Calcium: 9.1 mg/dL (ref 8.4–10.5)
Creatinine, Ser: 0.72 mg/dL (ref 0.50–1.10)
GFR calc Af Amer: >90 mL/min (ref >90)
GFR calc non Af Amer: >90 mL/min (ref >90)
Glucose, Bld: 144 mg/dL — ABNORMAL HIGH (ref 70–99)
POTASSIUM: 4.7 mmol/L (ref 3.5–5.1)
Sodium: 139 mmol/L (ref 135–145)

## 2014-08-21 LAB — CBC
HEMATOCRIT: 34.5 % — AB (ref 36.0–46.0)
HEMOGLOBIN: 11.7 g/dL — AB (ref 12.0–15.0)
MCH: 29.8 pg (ref 26.0–34.0)
MCHC: 33.9 g/dL (ref 30.0–36.0)
MCV: 87.8 fL (ref 78.0–100.0)
PLATELETS: 349 10*3/uL (ref 150–400)
RBC: 3.93 MIL/uL (ref 3.87–5.11)
RDW: 13.2 % (ref 11.5–15.5)
WBC: 19.3 10*3/uL — AB (ref 4.0–10.5)

## 2014-08-21 MED ORDER — APIXABAN 2.5 MG PO TABS: ORAL_TABLET | ORAL | Status: DC

## 2014-08-21 MED ORDER — DOCUSATE SODIUM 100 MG PO CAPS: ORAL_CAPSULE | ORAL | Status: DC

## 2014-08-21 MED ORDER — ACETAMINOPHEN 325 MG PO TABS: 650.0000 mg | ORAL_TABLET | Freq: Four times a day (QID) | ORAL | Status: AC | PRN

## 2014-08-21 MED ORDER — POLYETHYLENE GLYCOL 3350 17 G PO PACK: 17.0000 g | PACK | Freq: Two times a day (BID) | ORAL | Status: DC

## 2014-08-21 MED ORDER — CELECOXIB 200 MG PO CAPS: ORAL_CAPSULE | ORAL | Status: DC

## 2014-08-21 MED ORDER — OXYCODONE HCL 5 MG PO TABS: ORAL_TABLET | ORAL | Status: DC

## 2014-08-21 NOTE — Progress Notes (Signed)
Physical Therapy Treatment Patient Details Name: Evelyn Mcintosh MRN: [id number] DOB: 1961-05-18 Today's Date: 08/21/2014    History of Present Illness [name], 54 y.o. female, has a history of pain and functional disability in the left knee due to arthritis and has failed non-surgical conservative treatments for greater than 12 weeks to includeNSAID's and/or analgesics, corticosteriod injections, viscosupplementation injections, flexibility and strengthening excercises, supervised PT with diminished ADL's post treatment, use of assistive devices and activity modification. Onset of symptoms was gradual, starting 10 years ago with gradually worsening course since that time.  Pt s/p L TKA on 3/14.     PT Comments    Pt still demonstrates weakness and decreased ROM, consistent with post-operative state. Pt progressing towards all goals, now functioning at min guard level, anticipate pt to be safe for planned d/c this afternoon, pending medical clearance.  Will assess stair management this afternoon before d/c.    Follow Up Recommendations  Home health PT;Supervision - Intermittent     Equipment Recommendations  Rolling walker with 5" wheels    Recommendations for Other Services       Precautions / Restrictions Precautions Precautions: Knee Knee Immobilizer - Left:  (Able to perform ASLR, discontinued use of immobilizer) Restrictions Weight Bearing Restrictions: Yes LLE Weight Bearing: Weight bearing as tolerated    Mobility  Bed Mobility Overal bed mobility: Independent             General bed mobility comments: Able to navigate LLE in order to move supine to sit  Transfers Overall transfer level: Needs assistance Equipment used: Rolling walker (2 wheeled) Transfers: Sit to/from Stand Sit to Stand: Min guard         General transfer comment: Pt demonstrated proper hand placement for sit to stand transfers without v/c's  Ambulation/Gait Ambulation/Gait  assistance: Min guard Ambulation Distance (Feet): 200 Feet Assistive device: Rolling walker (2 wheeled) Gait Pattern/deviations: Step-through pattern Gait velocity: Slow   General Gait Details: Pt transitioned towards step through gait, with moderate v/c's needed for terminal knee extension and tactile cue's for smooth walker management   Stairs            Wheelchair Mobility    Modified Rankin (Stroke Patients Only)       Balance Overall balance assessment: Modified Independent         Standing balance support: Bilateral upper extremity supported Standing balance-Leahy Scale: Fair Standing balance comment: Able to stand with bilateral UE support with better postural control and stability                    Cognition Arousal/Alertness: Awake/alert Behavior During Therapy: WFL for tasks assessed/performed Overall Cognitive Status: Within Functional Limits for tasks assessed                      Exercises Total Joint Exercises Ankle Circles/Pumps: AROM;10 reps;Supine Quad Sets: AROM;10 reps;Supine Heel Slides: AROM;Left;10 reps;Supine Goniometric ROM: AROM knee flexion/extension: 10-55 degrees    General Comments        Pertinent Vitals/Pain Pain Assessment: Faces Faces Pain Scale: Hurts little more Pain Location: L knee Pain Descriptors / Indicators: Aching Pain Intervention(s): Monitored during session    Home Living                      Prior Function            PT Goals (current goals can now be found in the care plan  section) Acute Rehab PT Goals Patient Stated Goal: Return home Progress towards PT goals: Progressing toward goals (Will work on stair management this afternoon)    Frequency  7X/week    PT Plan Current plan remains appropriate    Co-evaluation             End of Session Equipment Utilized During Treatment: Gait belt Activity Tolerance: Patient tolerated treatment well Patient left: in  chair;with call bell/phone within reach (in knee extension supported by foam)     Time: [id number] PT Time Calculation (min) (ACUTE ONLY): 33 min  Charges:  $Gait Training: 8-22 mins $Therapeutic Exercise: 8-22 mins                    G Codes:      Mari Battaglia 08/21/2014, 10:31 AM  Vella RaringKailee Tanita Palinkas, SPT (student physical therapist) Office phone: [phone]

## 2014-08-21 NOTE — Discharge Instructions (Signed)
Information on my medicine - ELIQUIS (apixaban)  This medication education was reviewed with me or my healthcare representative as part of my discharge preparation.  The pharmacist that spoke with me during my hospital stay was:  Tena Linebaugh, Judie BonusKimberly Ballard, [facility]RPH  Why was Eliquis prescribed for you? Eliquis was prescribed for you to reduce the risk of blood clots forming after orthopedic surgery.    What do You need to know about Eliquis? Take your Eliquis TWICE DAILY - one tablet in the morning and one tablet in the evening with or without food.  It would be best to take the dose about the same time each day.  If you have difficulty swallowing the tablet whole please discuss with your pharmacist how to take the medication safely.  Take Eliquis exactly as prescribed by your doctor and DO NOT stop taking Eliquis without talking to the doctor who prescribed the medication.  Stopping without other medication to take the place of Eliquis may increase your risk of developing a clot.  After discharge, you should have regular check-up appointments with your healthcare provider that is prescribing your Eliquis.  What do you do if you miss a dose? If a dose of ELIQUIS is not taken at the scheduled time, take it as soon as possible on the same day and twice-daily administration should be resumed.  The dose should not be doubled to make up for a missed dose.  Do not take more than one tablet of ELIQUIS at the same time.  Important Safety Information A possible side effect of Eliquis is bleeding. You should call your healthcare provider right away if you experience any of the following: ? Bleeding from an injury or your nose that does not stop. ? Unusual colored urine (red or dark brown) or unusual colored stools (red or black). ? Unusual bruising for unknown reasons. ? A serious fall or if you hit your head (even if there is no bleeding).  Some medicines may interact with Eliquis and might  increase your risk of bleeding or clotting while on Eliquis. To help avoid this, consult your healthcare provider or pharmacist prior to using any new prescription or non-prescription medications, including herbals, vitamins, non-steroidal anti-inflammatory drugs (NSAIDs) and supplements.  This website has more information on Eliquis (apixaban): http://www.eliquis.com/eliquis/home

## 2014-08-21 NOTE — Progress Notes (Signed)
Pt foley cath was discontinued at 0530 pt was informed to ask for assistance to void. Ilean SkillVeronica Purnell Daigle LPN

## 2014-08-21 NOTE — Progress Notes (Signed)
Physical Therapy Treatment Note  Clinical Impression: Pt much improved this PM. Pt demo'd safe stair negotiation and is functioning at supervision level. Pt safe to d/c home once medically cleared.    08/21/14 1330  PT Visit Information  Last PT Received On 08/21/14  Assistance Needed +1  History of Present Illness s/p L TKA  PT Time Calculation  PT Start Time (ACUTE ONLY) 1334  PT Stop Time (ACUTE ONLY) 1354  PT Time Calculation (min) (ACUTE ONLY) 20 min  Subjective Data  Subjective Pt received at door, ready to ambulate and navigate stairs.  Patient Stated Goal Return home  Precautions  Precautions Knee  Restrictions  Weight Bearing Restrictions Yes  LLE Weight Bearing WBAT  Pain Assessment  Pain Assessment 0-10  Pain Score 3  Pain Location L knee  Pain Descriptors / Indicators Discomfort  Pain Intervention(s) Monitored during session  Cognition  Arousal/Alertness Awake/alert  Behavior During Therapy WFL for tasks assessed/performed  Overall Cognitive Status Within Functional Limits for tasks assessed  Bed Mobility  Overal bed mobility Independent  Transfers  Overall transfer level Needs assistance  Equipment used Rolling walker (2 wheeled)  Transfers Sit to/from Stand  Sit to Stand Supervision  General transfer comment Pt demonstrated proper hand placement for sit to stand with walker  Ambulation/Gait  Ambulation/Gait assistance Supervision  Ambulation Distance (Feet) 350 Feet  Assistive device Rolling walker (2 wheeled)  Gait Pattern/deviations Step-through pattern  Gait velocity WNL  General Gait Details Pt demonstrated step through gait pattern with improved posture and speed  Stairs Yes  Stairs assistance Supervision  Stair Management One rail Left;Sideways  Number of Stairs 12  General stair comments Pt demonstrated understanding of proper mechanics for ambulating stairs sideways facing railing  Balance  Overall balance assessment Modified Independent   Standing balance support Bilateral upper extremity supported;During functional activity  Standing balance-Leahy Scale Fair  Standing balance comment Able to ambulate with better postural control and less use of of B UE  Exercises  Exercises Total Joint  Total Joint Exercises  Long Arc Quad Seated;10 reps;AROM;Left  Knee Flexion Seated;AROM;10 reps;Left (With towel)  PT - End of Session  Equipment Utilized During Treatment Gait belt  Activity Tolerance Patient tolerated treatment well  Patient left in chair;with call bell/phone within reach;with family/visitor present  Nurse Communication Mobility status  PT - Assessment/Plan  PT Plan Current plan remains appropriate  PT Frequency (ACUTE ONLY) 7X/week  Follow Up Recommendations Home health PT;Supervision - Intermittent  PT equipment Rolling walker with 5" wheels  PT Goal Progression  Progress towards PT goals Progressing toward goals  PT General Charges  $$ ACUTE PT VISIT 1 Procedure  PT Treatments  $Gait Training 8-22 mins    Lewis ShockAshly Sarah-Jane Nazario, PT, DPT Pager #: [phone] Office #: [phone]

## 2014-08-21 NOTE — Evaluation (Signed)
Occupational Therapy Evaluation Patient Details Name: Evelyn Mcintosh MRN: [id number] DOB: 1960/07/16 Today's Date: 08/21/2014    History of Present Illness s/p L TKA   Clinical Impression   Pt was independent prior to admission. She currently requires min assist for ADL to assist with L LE dressing.  All education completed (see ADL comments) with pt and husband verbalizing understanding. Pt is eager to go home later today. No further OT needs.    Follow Up Recommendations  No OT follow up    Equipment Recommendations  None recommended by OT    Recommendations for Other Services       Precautions / Restrictions Precautions Precautions: Knee;Fall Restrictions LLE Weight Bearing: Weight bearing as tolerated      Mobility Bed Mobility Overal bed mobility: Independent                Transfers   Equipment used: Rolling walker (2 wheeled) Transfers: Sit to/from Stand Sit to Stand: Min guard         General transfer comment: good technique    Balance                                            ADL Overall ADL's : Needs assistance/impaired Eating/Feeding: Independent;Sitting   Grooming: Wash/dry hands;Min guard;Standing   Upper Body Bathing: Set up;Sitting   Lower Body Bathing: Minimal assistance;Sit to/from stand   Upper Body Dressing : Set up;Sitting   Lower Body Dressing: Minimal assistance;Sit to/from stand   Toilet Transfer: Min guard;Ambulation;Comfort height toilet;RW   Toileting- Architect and Hygiene: Min guard;Sit to/from stand       Functional mobility during ADLs: Min guard;Rolling walker General ADL Comments: Instructed pt in multiple uses of 3 in 1, safe footwear and transporting items with walker. Educated pt in availability of AE for LB ADL, but pt choosing to rely on her husband's help for the next week.  Declined practicing tub transfer today, will try with PT at home prior to attempting on her own.      Vision     Perception     Praxis      Pertinent Vitals/Pain Pain Assessment: 0-10 Pain Score: 3  Pain Location: L knee Pain Descriptors / Indicators: Sore Pain Intervention(s): Limited activity within patient's tolerance;Monitored during session;Premedicated before session;Repositioned     Hand Dominance Right   Extremity/Trunk Assessment             Communication Communication Communication: No difficulties   Cognition Arousal/Alertness: Awake/alert Behavior During Therapy: WFL for tasks assessed/performed Overall Cognitive Status: Within Functional Limits for tasks assessed                     General Comments       Exercises       Shoulder Instructions      Home Living Family/patient expects to be discharged to:: Private residence Living Arrangements: Spouse/significant other Available Help at Discharge: Family;Available 24 hours/day (for 1 week) Type of Home: House Home Access: Stairs to enter [employer] of Steps: 4 Entrance Stairs-Rails: Left Home Layout: One level     Bathroom Shower/Tub: Chief Strategy Officer: Standard     Home Equipment: Bedside commode (from her mother)          Prior Functioning/Environment Level of Independence: Independent  OT Diagnosis:     OT Problem List:     OT Treatment/Interventions:      OT Goals(Current goals can be found in the care plan section) Acute Rehab OT Goals Patient Stated Goal: Return home  OT Frequency:     Barriers to D/C:            Co-evaluation              End of Session    Activity Tolerance: Patient tolerated treatment well Patient left: in bed;with call bell/phone within reach;with family/visitor present   Time: 7425-95631155-1216 OT Time Calculation (min): 21 min Charges:  OT General Charges $OT Visit: 1 Procedure OT Evaluation $Initial OT Evaluation Tier I: 1 Procedure G-Codes:    Evern BioMayberry, Evelyn Mcintosh Evelyn Mcintosh 08/21/2014,  12:23 PM  [phone]

## 2014-08-21 NOTE — Progress Notes (Signed)
CARE MANAGEMENT NOTE 08/21/2014  Patient:  Evelyn Mcintosh,Tamalyn O   Account Number:  [id number]  Date Initiated:  08/21/2014  Documentation initiated by:  [facility],Vaneza Pickart  Subjective/Objective Assessment:   s/p left tKA     Action/Plan:   PT/OT evals- recommended HHPT   Anticipated DC Date:  08/21/2014   Anticipated DC Plan:  HOME W HOME HEALTH SERVICES      DC Planning Services  CM consult      [facility]  DURABLE MEDICAL EQUIPMENT  HOME HEALTH   Choice offered to / List presented to:  C-1 Patient   DME arranged  CPM  WALKER - ROLLING      DME agency  [employer]     HH arranged  HH-2 PT      [facility]   Status of service:  Completed, signed off  Discharge Disposition:  HOME W HOME HEALTH SERVICES  Per UR Regulation:  Reviewed for med. necessity/level of care/duration of stay  Comments:  08/21/14 [facility] by MD office. Spoke with patient and her husband, no change in d/c plan. Patient states that husband will be able to assist after d/c.Spoke with Kipp BroodBrent form [employer], they are providing CPM and rolling walker, patient alreday has 3n1. No other d/c needs identified.

## 2014-08-27 DIAGNOSIS — Z96659 Presence of unspecified artificial knee joint: Secondary | ICD-10-CM

## 2014-08-27 NOTE — Discharge Summary (Signed)
Patient ID: Evelyn Mcintosh MRN: [id number] DOB/AGE: 1960/07/06 54 y.o.  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 Asthma   . GERD (gastroesophageal reflux disease)   . Arthritis   . 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 female who was admitted 08/20/2014 for operative treatment ofPrimary localized osteoarthritis of left knee. Patient has severe unremitting pain that affects sleep, daily activities, and work/hobbies. After pre-op clearance the patient was taken to the operating room on 08/20/2014 and underwent  Procedure(s): TOTAL KNEE ARTHROPLASTY.    Patient was given perioperative antibiotics:  Anti-infectives    Start     Dose/Rate Route Frequency Ordered Stop   08/20/14 0840  cefUROXime (ZINACEF) injection  Status:  Discontinued       As needed 08/20/14 0840 08/20/14 0929   08/20/14 0600  ceFAZolin (ANCEF) IVPB 2 g/50 mL premix     2 g 100 mL/hr over 30 Minutes Intravenous On call to O.R. 08/19/14 1232 08/20/14 0740       Patient was given sequential compression devices, early ambulation, and chemoprophylaxis to prevent DVT.  Patient benefited maximally from hospital stay and there were no complications.    Recent vital signs: No data found.    Recent laboratory studies: No results for input(s): WBC, HGB, HCT, PLT, NA, K, CL, CO2, BUN, CREATININE, GLUCOSE, INR, CALCIUM in the last 72 hours.  Invalid input(s): PT, 2   Discharge Medications:     Medication  List    TAKE these medications        acetaminophen 325 MG tablet  Commonly known as:  TYLENOL  Take 2 tablets (650 mg total) by mouth every 6 (six) hours as needed for mild pain (or Fever >/= 101).     apixaban 2.5 MG Tabs tablet  Commonly known as:  ELIQUIS  1 tablet po bid to prevent blood clots     celecoxib 200 MG capsule  Commonly known as:  CELEBREX  1 tab po q day with food for pain and  swelling     docusate sodium 100 MG capsule  Commonly known as:  COLACE  1 tab 2 times a day while on narcotics.  STOOL SOFTENER     omeprazole 40 MG capsule  Commonly known as:  PRILOSEC  Take 40 mg by mouth daily.     oxyCODONE 5 MG immediate release tablet  Commonly known as:  Oxy IR/ROXICODONE  1-2 tablets every 4-6 hrs as needed for pain     polyethylene glycol packet  Commonly known as:  MIRALAX / GLYCOLAX  Take 17 g by mouth 2 (two) times daily.        Diagnostic Studies: No results found.  Disposition: 01-Home or Self Care      Discharge Instructions    CPM    Complete by:  As directed   Continuous passive motion machine (CPM):      Use the CPM from 0 to 90 for 6 hours per day.  You may break it up into 2 or 3 sessions per day.      Use CPM for 2 weeks or until you are told to stop.     Call MD / Call 911    Complete by:  As directed   If you experience chest pain or shortness of breath, CALL 911 and be transported to the hospital emergency room.  If you develope a fever above 101 F, pus (white drainage) or increased drainage or redness at the wound, or calf pain, call your surgeon's office.     Change dressing    Complete by:  As directed   Change the gauze dressing daily with sterile 4 x 4 inch gauze and apply TED hose.  DO NOT REMOVE BANDAGE OVER SURGICAL INCISION.  WASH WHOLE LEG INCLUDING OVER THE WATERPROOF BANDAGE WITH SOAP AND WATER EVERY DAY.     Constipation Prevention    Complete by:  As directed   Drink plenty of fluids.  Prune juice may be  helpful.  You may use a stool softener, such as Colace (over the counter) 100 mg twice a day.  Use MiraLax (over the counter) for constipation as needed.     Diet - low sodium heart healthy    Complete by:  As directed      Discharge instructions    Complete by:  As directed   Total Knee Replacement Care After Refer to this sheet in the next few weeks. These discharge instructions provide you with general information on caring for yourself after you leave the hospital. Your caregiver may also give you specific instructions. Your treatment has been planned according to the most current medical practices available, but unavoidable complications sometimes occur. If you have any problems or questions after discharge, please call your caregiver. Regaining a near full range of motion of your knee within the first 3 to 6 weeks after surgery is critical. HOME CARE INSTRUCTIONS  You may resume a normal diet and activities as directed.  Perform exercises as directed.  Place gray foam block, curve side up under heel at all times except when in CPM or when walking.  DO NOT modify, tear, cut, or change in any way the gray foam block. You will receive physical therapy daily  Take showers instead of baths until informed otherwise.  You may shower on Friday.  Please wash whole leg including wound with soap and water over aquacel dressing.  DO NOT REMOVE AQUACEL DRESSING.  DR Thurston Hole WILL TAKE IT OFF IN THE OFFICE AT THE POST OP APPOINTMENT  It is OK to take over-the-counter tylenol in addition to the oxycodone for pain, discomfort, or fever. Oxycodone is VERY constipating.  Please take stool softener twice a day and laxatives daily until bowels are regular Eat a well-balanced diet.  Avoid lifting or driving until you are instructed otherwise.  Make an appointment to see your caregiver for stitches (suture) or staple removal as directed.  If you have been sent home with a continuous passive motion machine (CPM  machine), 0-90 degrees 6 hrs a day   2 hrs a shift SEEK MEDICAL CARE IF: You have swelling of your calf or leg.  You develop shortness of breath or chest pain.  You have redness, swelling, or increasing pain in the wound.  There is pus or any unusual drainage coming from the surgical site.  You notice a bad smell coming from the surgical site or dressing.  The surgical site breaks open after  sutures or staples have been removed.  There is persistent bleeding from the suture or staple line.  You are getting worse or are not improving.  You have any other questions or concerns.  SEEK IMMEDIATE MEDICAL CARE IF:  You have a fever.  You develop a rash.  You have difficulty breathing.  You develop any reaction or side effects to medicines given.  Your knee motion is decreasing rather than improving.  MAKE SURE YOU:  Understand these instructions.  Will watch your condition.  Will get help right away if you are not doing well or get worse.     Do not put a pillow under the knee. Place it under the heel.    Complete by:  As directed   Place gray foam block, curve side up under heel at all times except when in CPM or when walking.  DO NOT modify, tear, cut, or change in any way the gray foam block.     Increase activity slowly as tolerated    Complete by:  As directed      TED hose    Complete by:  As directed   Use stockings (TED hose) for 2 weeks on both leg(s).  You may remove them at night for sleeping.           Follow-up Information    Follow up with [facility].   Why:  They will contact you to schedule home therapy visits.   Contact information:   [address] [place] [address] [phone]        Signed: Pascal Lux 08/27/2014, 9:35 AM

## 2015-06-19 ENCOUNTER — Ambulatory Visit
Admission: RE | Admit: 2015-06-19 | Discharge: 2015-06-19 | Disposition: A | Discharge status: Home / Self Care | Payer: BLUE CROSS/BLUE SHIELD | Source: Ambulatory Visit | Attending: Orthopedic Surgery | Admitting: Orthopedic Surgery

## 2015-06-19 ENCOUNTER — Other Ambulatory Visit: Payer: Self-pay | Admitting: Orthopedic Surgery

## 2015-06-19 DIAGNOSIS — M25561 Pain in right knee: Secondary | ICD-10-CM | POA: Diagnosis not present

## 2015-06-19 DIAGNOSIS — R52 Pain, unspecified: Secondary | ICD-10-CM

## 2015-06-19 DIAGNOSIS — Z96652 Presence of left artificial knee joint: Secondary | ICD-10-CM | POA: Insufficient documentation

## 2015-06-19 DIAGNOSIS — M25562 Pain in left knee: Secondary | ICD-10-CM | POA: Diagnosis not present

## 2015-06-19 DIAGNOSIS — M25569 Pain in unspecified knee: Secondary | ICD-10-CM | POA: Diagnosis present

## 2016-01-08 ENCOUNTER — Encounter (HOSPITAL_COMMUNITY): Payer: Self-pay | Admitting: Physician Assistant

## 2016-01-08 DIAGNOSIS — M1711 Unilateral primary osteoarthritis, right knee: Secondary | ICD-10-CM | POA: Diagnosis present

## 2016-01-08 NOTE — H&P (Signed)
TOTAL KNEE ADMISSION H&P  Patient is being admitted for right total knee arthroplasty.  Subjective:  Chief Complaint:right knee pain.  HPI: Evelyn Mcintosh, 55 y.o. female, has a history of pain and functional disability in the right knee due to arthritis and has failed non-surgical conservative treatments for greater than 12 weeks to includeNSAID's and/or analgesics, corticosteriod injections, viscosupplementation injections, flexibility and strengthening excercises, supervised PT with diminished ADL's post treatment, use of assistive devices, weight reduction as appropriate and activity modification.  Onset of symptoms was gradual, starting 10 years ago with gradually worsening course since that time. The patient noted no past surgery on the right knee(s).  Patient currently rates pain in the right knee(s) at 10 out of 10 with activity. Patient has night pain, worsening of pain with activity and weight bearing, pain that interferes with activities of daily living, crepitus and joint swelling.  Patient has evidence of subchondral sclerosis, periarticular osteophytes and joint space narrowing by imaging studies.  There is no active infection.  Patient Active Problem List   Diagnosis Date Noted  . Primary localized osteoarthritis of right knee   . 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 DJD (degenerative joint disease) of knee 08/20/2014  . Primary localized osteoarthritis of left knee 08/08/2014  . Asthma   . GERD (gastroesophageal reflux disease)   . Arthritis   . Lumbar pain with radiation down left leg 05/08/2014   Past Medical History:  Diagnosis Date  . Arthritis   . Asthma   . GERD (gastroesophageal reflux disease)   . Lumbar pain with radiation down left leg 05/2014  . Primary localized osteoarthritis of right knee   . 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 BREAST SURGERY     BENIGN  LUMPS  . KNEE ARTHROSCOPY W/ MENISCECTOMY Left 05/24/2009  . TOTAL KNEE ARTHROPLASTY Left 08/20/2014   Procedure: TOTAL KNEE ARTHROPLASTY;  Surgeon: Salvatore Marvel, MD;  [facility];  Service: Orthopedics;  Laterality: Left;    No prescriptions prior to admission.   No Known Allergies  Social History  Substance Use Topics  . Smoking status: Never Smoker  . Smokeless tobacco: Not on file  . Alcohol use No    Family History  Problem Relation Age of Onset  . Arthritis    . Diabetes    . Hypertension Father   . Stroke Father   . Atrial fibrillation Mother   . Alzheimer's disease Mother      Review of Systems  Constitutional: Negative.   HENT: Negative.   Eyes: Negative.   Respiratory: Negative.   Cardiovascular: Negative.   Gastrointestinal: Negative.   Genitourinary: Negative.   Musculoskeletal: Positive for back pain and joint pain.  Skin: Negative.   Neurological: Negative.   Endo/Heme/Allergies: Negative.   Psychiatric/Behavioral: Negative.     Objective:  Physical Exam  Constitutional: She is oriented to person, place, and time. She appears well-developed and well-nourished.  HENT:  Head: Normocephalic and atraumatic.  Mouth/Throat: Oropharynx is clear and moist.  Eyes: Conjunctivae are normal. Pupils are equal, round, and reactive to light.  Neck: Neck supple.  Cardiovascular: Normal rate and regular rhythm.   Respiratory: Effort normal and breath sounds normal.  GI: Soft. Bowel sounds are normal.  Genitourinary:  Genitourinary Comments: Not pertinent to current symptomatology therefore not examined.  Musculoskeletal:  Examination of her right knee reveals diffuse pain.  1+ effusion.  Range of motion 0-120 degrees.  Knee is stable with normal patella tracking.  Examination of her left knee reveals well healed total knee incision.  Mild pain distally over the anterior tibia.  Minimal swelling.  Full range of motion.  Knee is stable with normal patella  tracking.  Vascular exam: Pulses are 2+ and symmetric.    Neurological: She is alert and oriented to person, place, and time.  Skin: Skin is warm and dry.  Psychiatric: She has a normal mood and affect. Her behavior is normal.    Vital signs in last 24 hours: Temp:  [98 F (36.7 C)] 98 F (36.7 C) (08/02 1600) Pulse Rate:  [68] 68 (08/02 1600) BP: (163)/(81) 163/81 (08/02 1600) Weight:  [79.8 kg (176 lb)] 79.8 kg (176 lb) (08/02 1600)  Labs:   Estimated body mass index is 32.19 kg/m as calculated from the following:   Height as of this encounter:  (1.575 m).   Weight as of this encounter: 79.8 kg (176 lb).   Imaging Review Plain radiographs demonstrate severe degenerative joint disease of the right knee(s). The overall alignment ismild valgus. The bone quality appears to be good for age and reported activity level.  Assessment/Plan:  End stage arthritis, right knee   The patient history, physical examination, clinical judgment of the provider and imaging studies are consistent with end stage degenerative joint disease of the right knee(s) and total knee arthroplasty is deemed medically necessary. The treatment options including medical management, injection therapy arthroscopy and arthroplasty were discussed at length. The risks and benefits of total knee arthroplasty were presented and reviewed. The risks due to aseptic loosening, infection, stiffness, patella tracking problems, thromboembolic complications and other imponderables were discussed. The patient acknowledged the explanation, agreed to proceed with the plan and consent was signed. Patient is being admitted for inpatient treatment for surgery, pain control, PT, OT, prophylactic antibiotics, VTE prophylaxis, progressive ambulation and ADL's and discharge planning. The patient is planning to be discharged home with home health services

## 2016-01-17 ENCOUNTER — Encounter (HOSPITAL_COMMUNITY): Payer: Self-pay

## 2016-01-17 ENCOUNTER — Encounter (HOSPITAL_COMMUNITY)
Admission: RE | Admit: 2016-01-17 | Discharge: 2016-01-17 | Disposition: A | Discharge status: Home / Self Care | Payer: BLUE CROSS/BLUE SHIELD | Source: Ambulatory Visit | Attending: Orthopedic Surgery | Admitting: Orthopedic Surgery

## 2016-01-17 DIAGNOSIS — Z01812 Encounter for preprocedural laboratory examination: Secondary | ICD-10-CM | POA: Insufficient documentation

## 2016-01-17 DIAGNOSIS — Z0183 Encounter for blood typing: Secondary | ICD-10-CM | POA: Insufficient documentation

## 2016-01-17 DIAGNOSIS — M1711 Unilateral primary osteoarthritis, right knee: Secondary | ICD-10-CM | POA: Insufficient documentation

## 2016-01-17 DIAGNOSIS — Z01818 Encounter for other preprocedural examination: Secondary | ICD-10-CM | POA: Insufficient documentation

## 2016-01-17 HISTORY — DX: Personal history of other medical treatment: Z92.89

## 2016-01-17 LAB — COMPREHENSIVE METABOLIC PANEL
ALBUMIN: 3.9 g/dL (ref 3.5–5.0)
ALK PHOS: 54 U/L (ref 38–126)
ALT: 16 U/L (ref 14–54)
ANION GAP: 9 (ref 5–15)
AST: 16 U/L (ref 15–41)
BILIRUBIN TOTAL: 0.5 mg/dL (ref 0.3–1.2)
BUN: 10 mg/dL (ref 6–20)
CALCIUM: 9.4 mg/dL (ref 8.9–10.3)
CO2: 28 mmol/L (ref 22–32)
Chloride: 104 mmol/L (ref 101–111)
Creatinine, Ser: 0.74 mg/dL (ref 0.44–1.00)
GLUCOSE: 101 mg/dL — AB (ref 65–99)
POTASSIUM: 3.8 mmol/L (ref 3.5–5.1)
SODIUM: 141 mmol/L (ref 135–145)
TOTAL PROTEIN: 6.3 g/dL — AB (ref 6.5–8.1)

## 2016-01-17 LAB — CBC WITH DIFFERENTIAL/PLATELET
BASOS ABS: 0 10*3/uL (ref 0.0–0.1)
BASOS PCT: 0 %
EOS PCT: 3 %
Eosinophils Absolute: 0.2 10*3/uL (ref 0.0–0.7)
HCT: 39.8 % (ref 36.0–46.0)
Hemoglobin: 12.9 g/dL (ref 12.0–15.0)
Lymphocytes Relative: 32 %
Lymphs Abs: 2.1 10*3/uL (ref 0.7–4.0)
MCH: 28.9 pg (ref 26.0–34.0)
MCHC: 32.4 g/dL (ref 30.0–36.0)
MCV: 89 fL (ref 78.0–100.0)
MONO ABS: 0.6 10*3/uL (ref 0.1–1.0)
Monocytes Relative: 9 %
Neutro Abs: 3.7 10*3/uL (ref 1.7–7.7)
Neutrophils Relative %: 56 %
PLATELETS: 358 10*3/uL (ref 150–400)
RBC: 4.47 MIL/uL (ref 3.87–5.11)
RDW: 13 % (ref 11.5–15.5)
WBC: 6.5 10*3/uL (ref 4.0–10.5)

## 2016-01-17 LAB — TYPE AND SCREEN
ABO/RH(D): A NEG
ANTIBODY SCREEN: NEGATIVE

## 2016-01-17 LAB — SURGICAL PCR SCREEN
MRSA, PCR: NEGATIVE
STAPHYLOCOCCUS AUREUS: NEGATIVE

## 2016-01-17 LAB — PROTIME-INR
INR: 0.98
PROTHROMBIN TIME: 12.9 s (ref 11.4–15.2)

## 2016-01-17 LAB — APTT: APTT: 29 s (ref 24–36)

## 2016-01-17 NOTE — Pre-Procedure Instructions (Addendum)
    Evelyn Mcintosh  01/17/2016     Your procedure is scheduled on Monday, August 21.  [facility]:30 [facility] .               Your surgery or procedure is scheduled for 7:30 AM   Call this number if you have problems the morning of surgery:[phone]              For any other questions, please call [phone], Monday - Friday 8 AM - 4 PM.   Remember:  Do not eat food or drink liquids after midnight Sunday, August 20.  Take these medicines the morning of surgery with A SIP OF WATER:omeprazole (PRILOSEC).                May take acetaminophen (TYLENOL) .                  Stop taking Aspirin, Herbal medications, Vitamins..  Do not take any NSAIDs ie: Ibuprofen,  Advil,Naproxen or any medication containing Aspirin.   Do not wear jewelry, make-up or nail polish.  Do not wear lotions, powders, or perfumes.    Do not shave 48 hours prior to surgery.    Do not bring valuables to the hospital.  [facility] not [facility] any belongings or valuables.  Contacts, dentures or bridgework may not be worn into surgery.  Leave your suitcase in the car.  After surgery it may be brought to your room.  For patients admitted to the hospital, discharge time will be determined by your treatment team.  Please read over the following fact sheets that you were given. [place]- Preparing For Surgery and Patient Instructions for Mupirocin Application, Incentive Spirometry.

## 2016-01-18 LAB — URINE CULTURE: Culture: 2000 — AB

## 2016-01-26 MED ORDER — CEFAZOLIN SODIUM-DEXTROSE 2-4 GM/100ML-% IV SOLN
2.0000 g | INTRAVENOUS | Status: AC
  Administered 2016-01-27: 2 g via INTRAVENOUS
  Filled 2016-01-26: qty 100

## 2016-01-26 MED ORDER — LACTATED RINGERS IV SOLN
INTRAVENOUS | Status: DC
  Administered 2016-01-27 (×2): via INTRAVENOUS

## 2016-01-27 ENCOUNTER — Inpatient Hospital Stay (HOSPITAL_COMMUNITY): Payer: BLUE CROSS/BLUE SHIELD | Admitting: Anesthesiology

## 2016-01-27 ENCOUNTER — Inpatient Hospital Stay (HOSPITAL_COMMUNITY)
Admission: RE | Admit: 2016-01-27 | Discharge: 2016-01-28 | DRG: 470 | Disposition: A | Discharge status: Home Health | Payer: BLUE CROSS/BLUE SHIELD | Source: Ambulatory Visit | Attending: Orthopedic Surgery | Admitting: Orthopedic Surgery

## 2016-01-27 ENCOUNTER — Encounter (HOSPITAL_COMMUNITY): Payer: Self-pay | Admitting: *Deleted

## 2016-01-27 ENCOUNTER — Encounter (HOSPITAL_COMMUNITY)
Admission: RE | Disposition: A | Discharge status: Home Health | Payer: Self-pay | Source: Ambulatory Visit | Attending: Orthopedic Surgery

## 2016-01-27 DIAGNOSIS — Z96652 Presence of left artificial knee joint: Secondary | ICD-10-CM | POA: Diagnosis present

## 2016-01-27 DIAGNOSIS — M1711 Unilateral primary osteoarthritis, right knee: Principal | ICD-10-CM | POA: Diagnosis present

## 2016-01-27 DIAGNOSIS — M21161 Varus deformity, not elsewhere classified, right knee: Secondary | ICD-10-CM | POA: Diagnosis present

## 2016-01-27 DIAGNOSIS — Z96659 Presence of unspecified artificial knee joint: Secondary | ICD-10-CM

## 2016-01-27 DIAGNOSIS — M25561 Pain in right knee: Secondary | ICD-10-CM | POA: Diagnosis present

## 2016-01-27 HISTORY — DX: Presence of unspecified artificial knee joint: Z96.659

## 2016-01-27 HISTORY — PX: TOTAL KNEE ARTHROPLASTY: SHX125

## 2016-01-27 HISTORY — DX: Unilateral primary osteoarthritis, right knee: M17.11

## 2016-01-27 SURGERY — ARTHROPLASTY, KNEE, TOTAL
Anesthesia: Regional | Site: Knee | Laterality: Right

## 2016-01-27 MED ORDER — OXYCODONE HCL 5 MG PO TABS
5.0000 mg | ORAL_TABLET | ORAL | Status: DC | PRN
  Administered 2016-01-27 – 2016-01-28 (×6): 10 mg via ORAL
  Filled 2016-01-27 (×6): qty 2

## 2016-01-27 MED ORDER — SODIUM CHLORIDE 0.9 % IR SOLN
Status: DC | PRN
  Administered 2016-01-27: 1000 mL

## 2016-01-27 MED ORDER — PHENOL 1.4 % MT LIQD: 1.0000 | OROMUCOSAL | Status: DC | PRN

## 2016-01-27 MED ORDER — PROMETHAZINE HCL 25 MG/ML IJ SOLN: 6.2500 mg | INTRAMUSCULAR | Status: DC | PRN

## 2016-01-27 MED ORDER — ROPIVACAINE HCL 5 MG/ML IJ SOLN
INTRAMUSCULAR | Status: DC | PRN
  Administered 2016-01-27: 30 mL via PERINEURAL

## 2016-01-27 MED ORDER — POLYETHYLENE GLYCOL 3350 17 G PO PACK
17.0000 g | PACK | Freq: Two times a day (BID) | ORAL | Status: DC
  Administered 2016-01-27 – 2016-01-28 (×2): 17 g via ORAL
  Filled 2016-01-27 (×2): qty 1

## 2016-01-27 MED ORDER — DEXAMETHASONE SODIUM PHOSPHATE 10 MG/ML IJ SOLN
INTRAMUSCULAR | Status: DC | PRN
  Administered 2016-01-27: 10 mg via INTRAVENOUS

## 2016-01-27 MED ORDER — ALBUTEROL SULFATE (2.5 MG/3ML) 0.083% IN NEBU: 3.0000 mL | INHALATION_SOLUTION | Freq: Four times a day (QID) | RESPIRATORY_TRACT | Status: DC | PRN

## 2016-01-27 MED ORDER — HYDROMORPHONE HCL 1 MG/ML IJ SOLN
INTRAMUSCULAR | Status: AC
  Filled 2016-01-27: qty 1

## 2016-01-27 MED ORDER — METOCLOPRAMIDE HCL 5 MG/ML IJ SOLN: 5.0000 mg | Freq: Three times a day (TID) | INTRAMUSCULAR | Status: DC | PRN

## 2016-01-27 MED ORDER — APIXABAN 2.5 MG PO TABS
2.5000 mg | ORAL_TABLET | Freq: Two times a day (BID) | ORAL | Status: DC
  Administered 2016-01-28: 2.5 mg via ORAL
  Filled 2016-01-27: qty 1

## 2016-01-27 MED ORDER — CELECOXIB 200 MG PO CAPS
200.0000 mg | ORAL_CAPSULE | Freq: Two times a day (BID) | ORAL | Status: DC
  Administered 2016-01-27 – 2016-01-28 (×3): 200 mg via ORAL
  Filled 2016-01-27 (×3): qty 1

## 2016-01-27 MED ORDER — POVIDONE-IODINE 7.5 % EX SOLN
Freq: Once | CUTANEOUS | Status: DC
  Filled 2016-01-27: qty 118

## 2016-01-27 MED ORDER — 0.9 % SODIUM CHLORIDE (POUR BTL) OPTIME
TOPICAL | Status: DC | PRN
  Administered 2016-01-27: 1000 mL

## 2016-01-27 MED ORDER — DEXAMETHASONE SODIUM PHOSPHATE 10 MG/ML IJ SOLN
INTRAMUSCULAR | Status: AC
  Filled 2016-01-27: qty 1

## 2016-01-27 MED ORDER — PHENYLEPHRINE HCL 10 MG/ML IJ SOLN
INTRAMUSCULAR | Status: DC | PRN
  Administered 2016-01-27 (×3): 80 ug via INTRAVENOUS

## 2016-01-27 MED ORDER — FENTANYL CITRATE (PF) 100 MCG/2ML IJ SOLN
INTRAMUSCULAR | Status: AC
  Filled 2016-01-27: qty 2

## 2016-01-27 MED ORDER — OXYCODONE HCL 5 MG PO TABS
ORAL_TABLET | ORAL | Status: AC
  Filled 2016-01-27: qty 1

## 2016-01-27 MED ORDER — LIFITEGRAST 5 % OP SOLN: 1.0000 [drp] | Freq: Every day | OPHTHALMIC | Status: DC | PRN

## 2016-01-27 MED ORDER — HYDROMORPHONE HCL 1 MG/ML IJ SOLN: 1.0000 mg | INTRAMUSCULAR | Status: DC | PRN

## 2016-01-27 MED ORDER — CHLORHEXIDINE GLUCONATE 4 % EX LIQD: 60.0000 mL | Freq: Once | CUTANEOUS | Status: DC

## 2016-01-27 MED ORDER — OXYCODONE HCL 5 MG/5ML PO SOLN: 5.0000 mg | Freq: Once | ORAL | Status: AC | PRN

## 2016-01-27 MED ORDER — ONDANSETRON HCL 4 MG/2ML IJ SOLN
INTRAMUSCULAR | Status: AC
  Filled 2016-01-27: qty 2

## 2016-01-27 MED ORDER — FENTANYL CITRATE (PF) 100 MCG/2ML IJ SOLN
INTRAMUSCULAR | Status: DC | PRN
  Administered 2016-01-27: 25 ug via INTRAVENOUS
  Administered 2016-01-27: 100 ug via INTRAVENOUS
  Administered 2016-01-27 (×3): 25 ug via INTRAVENOUS

## 2016-01-27 MED ORDER — OXYCODONE HCL ER 20 MG PO T12A
20.0000 mg | EXTENDED_RELEASE_TABLET | Freq: Two times a day (BID) | ORAL | Status: DC
  Administered 2016-01-27 – 2016-01-28 (×2): 20 mg via ORAL
  Filled 2016-01-27 (×2): qty 1

## 2016-01-27 MED ORDER — ONDANSETRON HCL 4 MG/2ML IJ SOLN
INTRAMUSCULAR | Status: DC | PRN
  Administered 2016-01-27: 4 mg via INTRAVENOUS

## 2016-01-27 MED ORDER — MENTHOL 3 MG MT LOZG: 1.0000 | LOZENGE | OROMUCOSAL | Status: DC | PRN

## 2016-01-27 MED ORDER — PROPOFOL 500 MG/50ML IV EMUL
INTRAVENOUS | Status: DC | PRN
  Administered 2016-01-27: 50 ug/kg/min via INTRAVENOUS

## 2016-01-27 MED ORDER — CEFAZOLIN SODIUM-DEXTROSE 2-4 GM/100ML-% IV SOLN
2.0000 g | Freq: Four times a day (QID) | INTRAVENOUS | Status: AC
  Administered 2016-01-27 (×2): 2 g via INTRAVENOUS
  Filled 2016-01-27 (×3): qty 100

## 2016-01-27 MED ORDER — LIDOCAINE HCL (CARDIAC) 20 MG/ML IV SOLN
INTRAVENOUS | Status: DC | PRN
  Administered 2016-01-27: 20 mg via INTRAVENOUS
  Administered 2016-01-27: 10 mg via INTRAVENOUS

## 2016-01-27 MED ORDER — ONDANSETRON HCL 4 MG PO TABS: 4.0000 mg | ORAL_TABLET | Freq: Four times a day (QID) | ORAL | Status: DC | PRN

## 2016-01-27 MED ORDER — OXYCODONE HCL 5 MG PO TABS
5.0000 mg | ORAL_TABLET | Freq: Once | ORAL | Status: AC | PRN
  Administered 2016-01-27: 5 mg via ORAL

## 2016-01-27 MED ORDER — METOCLOPRAMIDE HCL 5 MG PO TABS: 5.0000 mg | ORAL_TABLET | Freq: Three times a day (TID) | ORAL | Status: DC | PRN

## 2016-01-27 MED ORDER — DOCUSATE SODIUM 100 MG PO CAPS
100.0000 mg | ORAL_CAPSULE | Freq: Two times a day (BID) | ORAL | Status: DC
  Administered 2016-01-27 – 2016-01-28 (×2): 100 mg via ORAL
  Filled 2016-01-27 (×2): qty 1

## 2016-01-27 MED ORDER — POTASSIUM CHLORIDE IN NACL 20-0.9 MEQ/L-% IV SOLN
INTRAVENOUS | Status: DC
  Administered 2016-01-27 – 2016-01-28 (×2): via INTRAVENOUS
  Filled 2016-01-27 (×2): qty 1000

## 2016-01-27 MED ORDER — BUPIVACAINE HCL (PF) 0.75 % IJ SOLN
INTRAMUSCULAR | Status: DC | PRN
  Administered 2016-01-27: 2 mL via INTRATHECAL

## 2016-01-27 MED ORDER — ALUM & MAG HYDROXIDE-SIMETH 200-200-20 MG/5ML PO SUSP
30.0000 mL | ORAL | Status: DC | PRN
Start: 2016-01-27 — End: 2016-01-28

## 2016-01-27 MED ORDER — DEXAMETHASONE SODIUM PHOSPHATE 10 MG/ML IJ SOLN
10.0000 mg | Freq: Three times a day (TID) | INTRAMUSCULAR | Status: DC
  Administered 2016-01-27 – 2016-01-28 (×3): 10 mg via INTRAVENOUS
  Filled 2016-01-27 (×3): qty 1

## 2016-01-27 MED ORDER — HYDROMORPHONE HCL 1 MG/ML IJ SOLN
0.2500 mg | INTRAMUSCULAR | Status: DC | PRN
  Administered 2016-01-27 (×3): 0.5 mg via INTRAVENOUS

## 2016-01-27 MED ORDER — ONDANSETRON HCL 4 MG/2ML IJ SOLN: 4.0000 mg | Freq: Four times a day (QID) | INTRAMUSCULAR | Status: DC | PRN

## 2016-01-27 MED ORDER — BUPIVACAINE-EPINEPHRINE (PF) 0.25% -1:200000 IJ SOLN
INTRAMUSCULAR | Status: AC
Start: 2016-01-27 — End: 2016-01-27
  Filled 2016-01-27: qty 30

## 2016-01-27 MED ORDER — ACETAMINOPHEN 650 MG RE SUPP: 650.0000 mg | Freq: Four times a day (QID) | RECTAL | Status: DC | PRN

## 2016-01-27 MED ORDER — PROPOFOL 10 MG/ML IV BOLUS
INTRAVENOUS | Status: AC
  Filled 2016-01-27: qty 20

## 2016-01-27 MED ORDER — BUPIVACAINE-EPINEPHRINE 0.25% -1:200000 IJ SOLN
INTRAMUSCULAR | Status: DC | PRN
  Administered 2016-01-27: 30 mL

## 2016-01-27 MED ORDER — ACETAMINOPHEN 325 MG PO TABS: 650.0000 mg | ORAL_TABLET | Freq: Four times a day (QID) | ORAL | Status: DC | PRN

## 2016-01-27 MED ORDER — DIPHENHYDRAMINE HCL 12.5 MG/5ML PO ELIX: 12.5000 mg | ORAL_SOLUTION | ORAL | Status: DC | PRN

## 2016-01-27 MED ORDER — PROPOFOL 10 MG/ML IV BOLUS
INTRAVENOUS | Status: DC | PRN
  Administered 2016-01-27: 30 mg via INTRAVENOUS
  Administered 2016-01-27: 20 mg via INTRAVENOUS

## 2016-01-27 MED ORDER — PANTOPRAZOLE SODIUM 40 MG PO TBEC
40.0000 mg | DELAYED_RELEASE_TABLET | Freq: Every day | ORAL | Status: DC
  Administered 2016-01-28: 40 mg via ORAL
  Filled 2016-01-27: qty 1

## 2016-01-27 MED ORDER — MIDAZOLAM HCL 2 MG/2ML IJ SOLN
INTRAMUSCULAR | Status: AC
Start: 2016-01-27 — End: 2016-01-27
  Filled 2016-01-27: qty 2

## 2016-01-27 MED ORDER — PHENYLEPHRINE 40 MCG/ML (10ML) SYRINGE FOR IV PUSH (FOR BLOOD PRESSURE SUPPORT)
PREFILLED_SYRINGE | INTRAVENOUS | Status: AC
  Filled 2016-01-27: qty 10

## 2016-01-27 MED ORDER — MIDAZOLAM HCL 5 MG/5ML IJ SOLN
INTRAMUSCULAR | Status: DC | PRN
  Administered 2016-01-27: 2 mg via INTRAVENOUS

## 2016-01-27 MED ORDER — HYDROMORPHONE HCL 1 MG/ML IJ SOLN
INTRAMUSCULAR | Status: DC | PRN
  Administered 2016-01-27 (×2): 0.5 mg via INTRAVENOUS

## 2016-01-27 SURGICAL SUPPLY — 66 items
BANDAGE ESMARK 6X9 LF (GAUZE/BANDAGES/DRESSINGS) ×1 IMPLANT
BENZOIN TINCTURE PRP APPL 2/3 (GAUZE/BANDAGES/DRESSINGS) ×3 IMPLANT
BLADE SAGITTAL 25.0X1.19X90 (BLADE) ×2 IMPLANT
BLADE SAGITTAL 25.0X1.19X90MM (BLADE) ×1
BLADE SAW SGTL 13X75X1.27 (BLADE) ×3 IMPLANT
BLADE SURG 10 STRL SS (BLADE) ×6 IMPLANT
BNDG ELASTIC 6X15 VLCR STRL LF (GAUZE/BANDAGES/DRESSINGS) ×3 IMPLANT
BNDG ESMARK 6X9 LF (GAUZE/BANDAGES/DRESSINGS) ×3
BOWL SMART MIX CTS (DISPOSABLE) ×3 IMPLANT
CAPT KNEE TOTAL 3 ATTUNE ×3 IMPLANT
CEMENT HV SMART SET (Cement) ×6 IMPLANT
CLOSURE WOUND 1/2 X4 (GAUZE/BANDAGES/DRESSINGS) ×1
COVER SURGICAL LIGHT HANDLE (MISCELLANEOUS) ×3 IMPLANT
CUFF TOURNIQUET SINGLE 34IN LL (TOURNIQUET CUFF) ×3 IMPLANT
CUFF TOURNIQUET SINGLE 44IN (TOURNIQUET CUFF) IMPLANT
DECANTER SPIKE VIAL GLASS SM (MISCELLANEOUS) ×3 IMPLANT
DRAPE EXTREMITY T 121X128X90 (DRAPE) ×3 IMPLANT
DRAPE INCISE IOBAN 66X45 STRL (DRAPES) ×3 IMPLANT
DRAPE PROXIMA HALF (DRAPES) ×3 IMPLANT
DRAPE U-SHAPE 47X51 STRL (DRAPES) ×3 IMPLANT
DRSG AQUACEL AG ADV 3.5X14 (GAUZE/BANDAGES/DRESSINGS) ×3 IMPLANT
DURAPREP 26ML APPLICATOR (WOUND CARE) ×6 IMPLANT
ELECT CAUTERY BLADE 6.4 (BLADE) ×3 IMPLANT
ELECT REM PT RETURN 9FT ADLT (ELECTROSURGICAL) ×3
ELECTRODE REM PT RTRN 9FT ADLT (ELECTROSURGICAL) ×1 IMPLANT
FACESHIELD WRAPAROUND (MASK) ×3 IMPLANT
GLOVE BIO SURGEON STRL SZ7 (GLOVE) ×3 IMPLANT
GLOVE BIOGEL PI IND STRL 7.0 (GLOVE) ×1 IMPLANT
GLOVE BIOGEL PI IND STRL 7.5 (GLOVE) ×1 IMPLANT
GLOVE BIOGEL PI INDICATOR 7.0 (GLOVE) ×2
GLOVE BIOGEL PI INDICATOR 7.5 (GLOVE) ×2
GLOVE SS BIOGEL STRL SZ 7.5 (GLOVE) ×1 IMPLANT
GLOVE SUPERSENSE BIOGEL SZ 7.5 (GLOVE) ×2
GOWN STRL REUS W/ TWL LRG LVL3 (GOWN DISPOSABLE) ×1 IMPLANT
GOWN STRL REUS W/ TWL XL LVL3 (GOWN DISPOSABLE) ×2 IMPLANT
GOWN STRL REUS W/TWL LRG LVL3 (GOWN DISPOSABLE) ×2
GOWN STRL REUS W/TWL XL LVL3 (GOWN DISPOSABLE) ×4
HANDPIECE INTERPULSE COAX TIP (DISPOSABLE) ×2
HOOD PEEL AWAY FACE SHEILD DIS (HOOD) ×6 IMPLANT
IMMOBILIZER KNEE 22 UNIV (SOFTGOODS) ×3 IMPLANT
KIT BASIN OR (CUSTOM PROCEDURE TRAY) ×3 IMPLANT
KIT ROOM TURNOVER OR (KITS) ×3 IMPLANT
MANIFOLD NEPTUNE II (INSTRUMENTS) ×3 IMPLANT
MARKER SKIN DUAL TIP RULER LAB (MISCELLANEOUS) ×3 IMPLANT
NEEDLE 18GX1X1/2 (RX/OR ONLY) (NEEDLE) ×3 IMPLANT
NS IRRIG 1000ML POUR BTL (IV SOLUTION) ×3 IMPLANT
PACK TOTAL JOINT (CUSTOM PROCEDURE TRAY) ×3 IMPLANT
PAD ARMBOARD 7.5X6 YLW CONV (MISCELLANEOUS) ×6 IMPLANT
SET HNDPC FAN SPRY TIP SCT (DISPOSABLE) ×1 IMPLANT
STRIP CLOSURE SKIN 1/2X4 (GAUZE/BANDAGES/DRESSINGS) ×2 IMPLANT
SUCTION FRAZIER HANDLE 10FR (MISCELLANEOUS) ×2
SUCTION TUBE FRAZIER 10FR DISP (MISCELLANEOUS) ×1 IMPLANT
SUT MNCRL AB 3-0 PS2 18 (SUTURE) ×3 IMPLANT
SUT VIC AB 0 CT1 27 (SUTURE) ×4
SUT VIC AB 0 CT1 27XBRD ANBCTR (SUTURE) ×2 IMPLANT
SUT VIC AB 1 CT1 27 (SUTURE) ×2
SUT VIC AB 1 CT1 27XBRD ANBCTR (SUTURE) ×1 IMPLANT
SUT VIC AB 2-0 CT1 27 (SUTURE) ×4
SUT VIC AB 2-0 CT1 TAPERPNT 27 (SUTURE) ×2 IMPLANT
SYR 30ML LL (SYRINGE) ×3 IMPLANT
TOWEL OR 17X24 6PK STRL BLUE (TOWEL DISPOSABLE) ×3 IMPLANT
TOWEL OR 17X26 10 PK STRL BLUE (TOWEL DISPOSABLE) ×3 IMPLANT
TRAY FOLEY CATH 16FR SILVER (SET/KITS/TRAYS/PACK) ×3 IMPLANT
TUBE CONNECTING 12'X1/4 (SUCTIONS) ×1
TUBE CONNECTING 12X1/4 (SUCTIONS) ×2 IMPLANT
YANKAUER SUCT BULB TIP NO VENT (SUCTIONS) ×3 IMPLANT

## 2016-01-27 NOTE — Anesthesia Postprocedure Evaluation (Addendum)
Anesthesia Post Note  Patient: Evelyn Mcintosh  Procedure(s) Performed: Procedure(s) (LRB): RIGHT TOTAL KNEE ARTHROPLASTY (Right)  Patient location during evaluation: PACU Anesthesia Type: Spinal Level of consciousness: oriented and awake and alert Pain management: pain level controlled Vital Signs Assessment: post-procedure vital signs reviewed and stable Respiratory status: spontaneous breathing, respiratory function stable and patient connected to nasal cannula oxygen Cardiovascular status: blood pressure returned to baseline and stable Postop Assessment: no headache and no backache Anesthetic complications: no    Last Vitals:  Vitals:   01/08/16 1600 01/27/16 0553  BP: (!) 163/81 (!) 173/90  Pulse: 68 66  Resp:  20  Temp: 36.7 C 37.5 C    Last Pain:  Vitals:   01/27/16 0553  TempSrc: Oral                 Dorie RankHolly M Quinn

## 2016-01-27 NOTE — Interval H&P Note (Signed)
History and Physical Interval Note:  01/27/2016 6:39 AM  Evelyn Mcintosh  has presented today for surgery, with the diagnosis of Primary localized OA right knee  The various methods of treatment have been discussed with the patient and family. After consideration of risks, benefits and other options for treatment, the patient has consented to  Procedure(s): RIGHT TOTAL KNEE ARTHROPLASTY (Right) as a surgical intervention .  The patient's history has been reviewed, patient examined, no change in status, stable for surgery.  I have reviewed the patient's chart and labs.  Questions were answered to the patient's satisfaction.     Salvatore MarvelWAINER,Inmer Nix A

## 2016-01-27 NOTE — Anesthesia Procedure Notes (Signed)
Anesthesia Regional Block:  Adductor canal block  Pre-Anesthetic Checklist: ,, timeout performed, Correct Patient, Correct Site, Correct Laterality, Correct Procedure, Correct Position, site marked, Risks and benefits discussed,  Surgical consent,  Pre-op evaluation,  At surgeon's request and post-op pain management  Laterality: Right  Prep: chloraprep       Needles:  Injection technique: Single-shot  Needle Type: Stimiplex     Needle Length: 9cm 9 cm Needle Gauge: 21 G    Additional Needles:  Procedures: ultrasound guided (picture in chart) Adductor canal block Narrative:  Injection made incrementally with aspirations every 5 mL.  Performed by: Personally  Anesthesiologist: Covey Baller  Additional Notes: Risks, benefits and alternative to block explained extensively.  Patient tolerated procedure well, without complications.      

## 2016-01-27 NOTE — Anesthesia Procedure Notes (Signed)
Spinal  Patient location during procedure: OR Staffing Anesthesiologist: Gerard Cantara Performed: anesthesiologist  Preanesthetic Checklist Completed: patient identified, site marked, surgical consent, pre-op evaluation, timeout performed, IV checked, risks and benefits discussed and monitors and equipment checked Spinal Block Patient position: sitting Prep: ChloraPrep Patient monitoring: heart rate, cardiac monitor, continuous pulse ox and blood pressure Approach: midline Location: L3-4 Needle Needle type: Pencan  Needle gauge: 24 G Needle length: 5 cm Assessment Sensory level: T6 Additional Notes Functioning IV was confirmed and monitors were applied. Sterile prep and drape, including hand hygiene, mask and sterile gloves were used. The patient was positioned and the spine was prepped. The skin was anesthetized with lidocaine.  Free flow of clear CSF was obtained prior to injecting local anesthetic into the CSF.  The spinal needle aspirated freely following injection.  The needle was carefully withdrawn.  The patient tolerated the procedure well. Consent was obtained prior to procedure with all questions answered and concerns addressed.  Evelyn DessertBen Xzayvier Fagin, MD

## 2016-01-27 NOTE — Op Note (Signed)
MRN:     [id number] DOB/AGE:    1960/06/11 / 55 y.o.       OPERATIVE REPORT    DATE OF PROCEDURE:  01/27/2016       PREOPERATIVE DIAGNOSIS:   Primary localized OA right knee      Estimated body mass index is 31.71 kg/m as calculated from the following:   Height as of 01/17/16: 5\' 3"  (1.6 m).   Weight as of this encounter: 81.2 kg (179 lb).                                                        POSTOPERATIVE DIAGNOSIS:   same                                                                    PROCEDURE:  Procedure(s): RIGHT TOTAL KNEE ARTHROPLASTY Using Depuy Attune RP implants #5 narrow Femur, #4Tibia, 5mm  RP bearing, 29 Patella     SURGEON: Mikiya Nebergall A    ASSISTANT:  Kirstin Shepperson PA-C   (Present and scrubbed throughout the case, critical for assistance with exposure, retraction, instrumentation, and closure.)         ANESTHESIA: Spinal with Adductor Nerve Block     TOURNIQUET TIME: 70min   COMPLICATIONS:  None     SPECIMENS: None   INDICATIONS FOR PROCEDURE: The patient has  djd right knee, varus deformities, XR shows bone on bone arthritis. Patient has failed all conservative measures including anti-inflammatory medicines, narcotics, attempts at  exercise and weight loss, cortisone injections and viscosupplementation.  Risks and benefits of surgery have been discussed, questions answered.   DESCRIPTION OF PROCEDURE: The patient identified by armband, received  right femoral nerve block and IV antibiotics, [facility] Patient taken to the operating room, appropriate anesthetic  monitors were attached General endotracheal anesthesia induced with  the patient in supine position, Foley catheter was inserted. Tourniquet  applied high to the operative thigh. Lateral post and foot positioner  applied to the table, the lower extremity was then prepped and draped  in usual sterile fashion from the ankle to the tourniquet. Time-out procedure was  performed. The limb was wrapped with an Esmarch bandage and the tourniquet inflated to 365 mmHg. We began the operation by making the anterior midline incision starting at handbreadth above the patella going over the patella 1 cm medial to and  4 cm distal to the tibial tubercle. Small bleeders in the skin and the  subcutaneous tissue identified and cauterized. Transverse retinaculum was incised and reflected medially and a medial parapatellar arthrotomy was accomplished. the patella was everted and theprepatellar fat pad resected. The superficial medial collateral  ligament was then elevated from anterior to posterior along the proximal  flare of the tibia and anterior half of the menisci resected. The knee was hyperflexed exposing bone on bone arthritis. Peripheral and notch osteophytes as well as the cruciate ligaments were then resected. We continued to  work our way around posteriorly along the proximal tibia, and externally  rotated the tibia subluxing it out  from underneath the femur. A McHale  retractor was placed through the notch and a lateral Hohmann retractor  placed, and we then drilled through the proximal tibia in line with the  axis of the tibia followed by an intramedullary guide rod and 2-degree  posterior slope cutting guide. The tibial cutting guide was pinned into place  allowing resection of 4 mm of bone medially and about 6 mm of bone  laterally because of her varus deformity. Satisfied with the tibial resection, we then  entered the distal femur 2 mm anterior to the PCL origin with the  intramedullary guide rod and applied the distal femoral cutting guide  set at 11mm, with 5 degrees of valgus. This was pinned along the  epicondylar axis. At this point, the distal femoral cut was accomplished without difficulty. We then sized for a #5 narrow femoral component and pinned the guide in 3 degrees of external rotation.The chamfer cutting guide was pinned into place. The anterior,  posterior, and chamfer cuts were accomplished without difficulty followed by  the  RP box cutting guide and the box cut. We also removed posterior osteophytes from the posterior femoral condyles. At this  time, the knee was brought into full extension. We checked our  extension and flexion gaps and found them symmetric at 5mm.  The patella thickness measured at 23 mm. We set the cutting guide at 14 and removed the posterior 9.5-10 mm  of the patella sized for 29 button and drilled the lollipop. The knee  was then once again hyperflexed exposing the proximal tibia. We sized for a #4 tibial base plate, applied the smokestack and the conical reamer followed by the the Delta fin keel punch. We then hammered into place the  RP trial femoral component, inserted a 1 trial bearing, trial patellar button, and took the knee through range of motion from 0-130 degrees. No thumb pressure was required for patellar  tracking. At this point, all trial components were removed, a double batch of DePuy HV cement was mixed and applied to all bony metallic mating surfaces except for the posterior condyles of the femur itself. In order, we  hammered into place the tibial tray and removed excess cement, the femoral component and removed excess cement, a 5mm  RP bearing  was inserted, and the knee brought to full extension with compression.  The patellar button was clamped into place, and excess cement  removed. While the cement cured the wound was irrigated out with normal saline solution pulse lavage.. Ligament stability and patellar tracking were checked and found to be excellent.. The parapatellar arthrotomy was closed with  #1 Vicryl suture. The subcutaneous tissue with 0 and 2-0 undyed  Vicryl suture, and 4-0 Monocryl.. A dressing of Aquaseal,  4 x 4, dressing sponges, Webril, and Ace wrap applied. Needle and sponge count were correct times 2.The patient awakened, extubated, and taken to recovery room without difficulty.  Vascular status was normal, pulses 2+ and symmetric.   Matan Steen A 01/27/2016, 8:57 AM

## 2016-01-27 NOTE — Anesthesia Procedure Notes (Signed)
Procedure Name: MAC Date/Time: 01/27/2016 7:52 AM Performed by: Dorie RankQUINN, Marelin Tat M Pre-anesthesia Checklist: Emergency Drugs available, Patient identified, Suction available, Patient being monitored and Timeout performed Patient Re-evaluated:Patient Re-evaluated prior to inductionOxygen Delivery Method: Nasal cannula Preoxygenation: Pre-oxygenation with 100% oxygen Intubation Type: IV induction Ventilation: Nasal airway inserted- appropriate to patient size Placement Confirmation: breath sounds checked- equal and bilateral and positive ETCO2

## 2016-01-27 NOTE — Anesthesia Preprocedure Evaluation (Signed)
Anesthesia Evaluation  Patient identified by MRN, date of birth, ID band Patient awake    Reviewed: Allergy & Precautions, NPO status , Patient's Chart, lab work & pertinent test results  Airway Mallampati: II       Dental  (+) Teeth Intact, Dental Advisory Given   Pulmonary asthma ,    breath sounds clear to auscultation       Cardiovascular negative cardio ROS   Rhythm:Regular     Neuro/Psych    GI/Hepatic Neg liver ROS, GERD  Medicated,  Endo/Other  negative endocrine ROS  Renal/GU negative Renal ROS     Musculoskeletal   Abdominal (+) + obese,   Peds  Hematology 13/40   Anesthesia Other Findings   Reproductive/Obstetrics                             Anesthesia Physical  Anesthesia Plan  ASA: II  Anesthesia Plan: Spinal and Regional   Post-op Pain Management:  Regional for Post-op pain   Induction: Intravenous  Airway Management Planned: Oral ETT  Additional Equipment:   Intra-op Plan:   Post-operative Plan: Extubation in OR  Informed Consent: I have reviewed the patients History and Physical, chart, labs and discussed the procedure including the risks, benefits and alternatives for the proposed anesthesia with the patient or authorized representative who has indicated his/her understanding and acceptance.     Plan Discussed with:   Anesthesia Plan Comments: (Adductor block + spinal )        Anesthesia Quick Evaluation

## 2016-01-27 NOTE — Transfer of Care (Signed)
Immediate Anesthesia Transfer of Care Note  Patient: Evelyn Mcintosh  Procedure(s) Performed: Procedure(s): RIGHT TOTAL KNEE ARTHROPLASTY (Right)  Patient Location: PACU  Anesthesia Type:MAC  Level of Consciousness: awake, alert  and oriented  Airway & Oxygen Therapy: Patient connected to nasal cannula oxygen  Post-op Assessment: Report given to RN  Post vital signs: stable  Last Vitals:  Vitals:   01/08/16 1600 01/27/16 0553  BP: (!) 163/81 (!) 173/90  Pulse: 68 66  Resp:  20  Temp: 36.7 C 37.5 C    Last Pain:  Vitals:   01/27/16 0553  TempSrc: Oral      Patients Stated Pain Goal: 3 (01/27/16 0604)  Complications: No apparent anesthesia complications

## 2016-01-27 NOTE — Evaluation (Signed)
Physical Therapy Evaluation Patient Details Name: Evelyn Mcintosh MRN: [id number] DOB: [birth year] Today's Date: 01/27/2016   History of Present Illness  Pt admitted 8/21 for elective R TKA. Pt had previous L TKA 08/2014.  Clinical Impression  Pt is s/p TKA resulting in the deficits listed below (see PT Problem List). Pt OOB mobility limited by feeling hot and diapheresis. Anticipate pt to progress well as she previous underwent a L TKA 08/2014.  Pt will benefit from skilled PT to increase their independence and safety with mobility to allow discharge to the venue listed below.      Follow Up Recommendations Home health PT;Supervision/Assistance - 24 hour    Equipment Recommendations  None recommended by PT    Recommendations for Other Services       Precautions / Restrictions Precautions Precautions: Fall;Knee Precaution Booklet Issued: Yes (comment) Precaution Comments: educated on HEP and not to place anything under knee Required Braces or Orthoses: Knee Immobilizer - Right Knee Immobilizer - Right: On when out of bed or walking Restrictions Weight Bearing Restrictions: Yes RLE Weight Bearing: Weight bearing as tolerated      Mobility  Bed Mobility Overal bed mobility: Needs Assistance Bed Mobility: Supine to Sit     Supine to sit: Supervision     General bed mobility comments: increased time, v/c's for R LE management, utilized long sit position  Transfers Overall transfer level: Needs assistance Equipment used: 4-wheeled walker Transfers: Sit to/from [employer] CorporationStand;Stand Pivot Transfers Sit to Stand: Min assist Stand pivot transfers: Min assist       General transfer comment: pt c/o "i'm getting really hot."  v/c's for sequencing,, used R KI  Ambulation/Gait             General Gait Details: limited to 3 steps to chair due to feeling "hot" and diapheretic  Stairs            Wheelchair Mobility    Modified Rankin (Stroke Patients Only)        Balance Overall balance assessment:  (needs RW for amb due to recent TKA)                                           Pertinent Vitals/Pain Pain Assessment: 0-10 Pain Score: 5  Pain Location: R knee Pain Descriptors / Indicators: Aching Pain Intervention(s): Monitored during session    Home Living Family/patient expects to be discharged to:: Private residence Living Arrangements: Spouse/significant other Available Help at Discharge: Family;Available 24 hours/day (through saturday and then spouse returns to work) Type of Home: House Home Access: Stairs to enter Entrance Stairs-Rails: Left Entrance Stairs-Number of Steps: 4 Home Layout: One level Home Equipment: Environmental consultantWalker - 2 wheels;Bedside commode      Prior Function Level of Independence: Independent               Hand Dominance   Dominant Hand: Right    Extremity/Trunk Assessment   Upper Extremity Assessment: Overall WFL for tasks assessed           Lower Extremity Assessment: RLE deficits/detail RLE Deficits / Details: pt able to initiated quad set and knee flexion to30 degress    Cervical / Trunk Assessment: Normal  Communication   Communication: No difficulties  Cognition Arousal/Alertness: Awake/alert Behavior During Therapy: WFL for tasks assessed/performed Overall Cognitive Status: Within Functional Limits for tasks assessed  General Comments      Exercises Total Joint Exercises Ankle Circles/Pumps: AROM;Both;10 reps;Supine Quad Sets: AROM;Right;10 reps;Supine Heel Slides: AAROM;Right;10 reps;Sidelying      Assessment/Plan    PT Assessment Patient needs continued PT services  PT Diagnosis Difficulty walking;Acute pain   PT Problem List Decreased strength;Decreased range of motion;Decreased activity tolerance;Decreased balance;Decreased mobility  PT Treatment Interventions DME instruction;Gait training;Stair training;Therapeutic  exercise;Balance training;Functional mobility training;Therapeutic activities   PT Goals (Current goals can be found in the Care Plan section) Acute Rehab PT Goals Patient Stated Goal: walk today PT Goal Formulation: With patient Time For Goal Achievement: 02/03/16 Potential to Achieve Goals: Good    Frequency 7X/week   Barriers to discharge   spouse returns to work saturday    Co-evaluation               End of Session Equipment Utilized During Treatment: Gait belt Activity Tolerance: Patient tolerated treatment well Patient left: in chair;with call bell/phone within reach;with family/visitor present Nurse Communication: Mobility status (and feeling of "hot")         Time: 1410-1442 PT Time Calculation (min) (ACUTE ONLY): 32 min   Charges:   PT Evaluation $PT Eval Moderate Complexity: 1 Procedure PT Treatments $Therapeutic Activity: 8-22 mins   PT G CodesMarcene Brawn:        Chastin Garlitz Marie 01/27/2016, 3:14 PM  Lewis ShockAshly Kasee Hantz, PT, DPT Pager #: [phone] Office #: [phone]

## 2016-01-27 NOTE — Progress Notes (Signed)
Orthopedic Tech Progress Note Patient Details:  Evelyn Mcintosh 04-14-1961 [id number]  CPM Right Knee CPM Right Knee: On Right Knee Flexion (Degrees): 90 Right Knee Extension (Degrees): 0 Additional Comments: Trapeze bar and foot roll   Saul FordyceJennifer C Toshi Ishii 01/27/2016, 10:28 AM

## 2016-01-28 ENCOUNTER — Encounter (HOSPITAL_COMMUNITY): Payer: Self-pay | Admitting: Orthopedic Surgery

## 2016-01-28 LAB — BASIC METABOLIC PANEL
Anion gap: 7 (ref 5–15)
BUN: 10 mg/dL (ref 6–20)
CHLORIDE: 105 mmol/L (ref 101–111)
CO2: 27 mmol/L (ref 22–32)
CREATININE: 0.69 mg/dL (ref 0.44–1.00)
Calcium: 9.2 mg/dL (ref 8.9–10.3)
GFR calc Af Amer: >60 mL/min (ref >60)
GLUCOSE: 156 mg/dL — AB (ref 65–99)
Potassium: 4.5 mmol/L (ref 3.5–5.1)
SODIUM: 139 mmol/L (ref 135–145)

## 2016-01-28 LAB — CBC
HCT: 33.9 % — ABNORMAL LOW (ref 36.0–46.0)
Hemoglobin: 11.1 g/dL — ABNORMAL LOW (ref 12.0–15.0)
MCH: 29 pg (ref 26.0–34.0)
MCHC: 32.7 g/dL (ref 30.0–36.0)
MCV: 88.5 fL (ref 78.0–100.0)
PLATELETS: 305 10*3/uL (ref 150–400)
RBC: 3.83 MIL/uL — ABNORMAL LOW (ref 3.87–5.11)
RDW: 13.1 % (ref 11.5–15.5)
WBC: 17.4 10*3/uL — ABNORMAL HIGH (ref 4.0–10.5)

## 2016-01-28 MED ORDER — OXYCODONE HCL ER 20 MG PO T12A: EXTENDED_RELEASE_TABLET | ORAL | 0 refills | Status: DC

## 2016-01-28 MED ORDER — POLYETHYLENE GLYCOL 3350 17 G PO PACK: PACK | ORAL | 0 refills | Status: AC

## 2016-01-28 MED ORDER — DOCUSATE SODIUM 100 MG PO CAPS: ORAL_CAPSULE | ORAL | 0 refills | Status: AC

## 2016-01-28 MED ORDER — OXYCODONE HCL 5 MG PO TABS: ORAL_TABLET | ORAL | 0 refills | Status: DC

## 2016-01-28 MED ORDER — APIXABAN 2.5 MG PO TABS: ORAL_TABLET | ORAL | 0 refills | Status: DC

## 2016-01-28 MED ORDER — CELECOXIB 200 MG PO CAPS: ORAL_CAPSULE | ORAL | 3 refills | Status: DC

## 2016-01-28 NOTE — Discharge Summary (Signed)
Patient ID: Evelyn Mcintosh Aikens MRN: [id number] DOB/AGE: 10/18/1960 55 y.Mcintosh.  Admit date: 01/27/2016 Discharge date: 01/28/2016  Admission Diagnoses:  Active Problems:   Primary localized osteoarthritis of right knee   S/P left total knee replacement using cement   Discharge Diagnoses:  Same  Past Medical History:  Diagnosis Date  . Arthritis   . Asthma   . GERD (gastroesophageal reflux disease)   . History of blood transfusion   . Lumbar pain with radiation down left leg 05/2014  . Primary localized osteoarthritis of right knee   . 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 female who was admitted 01/27/2016 for operative treatment of<principal problem not specified>. Patient has severe unremitting pain that affects sleep, daily activities, and work/hobbies. After pre-op clearance the patient was taken to the operating room on 01/27/2016 and underwent  Procedure(s): RIGHT TOTAL KNEE ARTHROPLASTY.    Patient was given perioperative antibiotics:  Anti-infectives    Start     Dose/Rate Route Frequency Ordered Stop   01/27/16 1400  ceFAZolin (ANCEF) IVPB 2g/100 mL premix     2 g 200 mL/hr over 30 Minutes Intravenous Every 6 hours 01/27/16 1123 01/27/16 2101   01/27/16 0700  ceFAZolin (ANCEF) IVPB 2g/100 mL premix     2 g 200 mL/hr over 30 Minutes Intravenous To ShortStay Surgical 01/26/16 0910 01/27/16 0752       Patient was given sequential compression devices, early ambulation, and chemoprophylaxis to prevent DVT.  Patient benefited maximally from hospital stay and there were no complications.    Recent vital signs:  Patient Vitals for the past 24 hrs:  BP Temp Temp src Pulse Resp SpO2  01/28/16 0523 (!) 157/77 98 F (36.7 C) Oral 82 13 100 %  01/28/16 0010 (!) 157/63 97.9 F (36.6 C) Oral 74  15 98 %  01/27/16 2119 (!) 153/70 97.9 F (36.6 C) Oral 79 15 100 %  01/27/16 1053 (!) 159/68 97.8 F (36.6 C) Oral 73 13 96 %  01/27/16 1030 (!) 145/65 97.8 F (36.6 C) - 68 12 96 %  01/27/16 1015 (!) 153/75 - - 68 15 100 %  01/27/16 1000 (!) 147/73 - - 74 15 100 %  01/27/16 0945 (!) 155/85 - - 76 17 (!) 15 %  01/27/16 0930 119/79 97.4 F (36.3 C) - 77 16 100 %     Recent laboratory studies:   Recent Labs  01/28/16 0455  WBC 17.4*  HGB 11.1*  HCT 33.9*  PLT 305  NA 139  K 4.5  CL 105  CO2 27  BUN 10  CREATININE 0.69  GLUCOSE 156*  CALCIUM 9.2     Discharge Medications:     Medication List    TAKE these medications   acetaminophen 325 MG tablet Commonly known as:  TYLENOL Take 2 tablets (650 mg total) by mouth every 6 (six) hours as needed for mild pain (or Fever >/= 101).   albuterol 108 (90 Base) MCG/ACT inhaler Commonly known as:  PROVENTIL HFA;VENTOLIN HFA Inhale 2 puffs into the lungs every 6 (six) hours as needed.   apixaban 2.5 MG Tabs tablet Commonly known as:  ELIQUIS 1 tablet twice a day to prevent blood clots.  When finished taking this medication start taking 81 mg aspirin daily   celecoxib 200 MG capsule Commonly  known as:  CELEBREX 1 tab po q day with food for pain and  swelling   docusate sodium 100 MG capsule Commonly known as:  COLACE 1 tab 2 times a day while on narcotics.  STOOL SOFTENER   omeprazole 40 MG capsule Commonly known as:  PRILOSEC Take 40 mg by mouth daily.   oxyCODONE 5 MG immediate release tablet Commonly known as:  Oxy IR/ROXICODONE 1-2 tablets every 3 hrs as needed for breakthrough pain.  SHORT ACTING PAIN MEDICATION   oxyCODONE 20 mg 12 hr tablet Commonly known as:  OXYCONTIN 1 tablet every 12 hours  LONG ACTING PAIN MEDICATION   polyethylene glycol packet Commonly known as:  MIRALAX / GLYCOLAX 17grams in 16 oz of water twice a day until bowel movement.  LAXITIVE.  Restart if two days since last bowel  movement   XIIDRA 5 % Soln Generic drug:  Lifitegrast Place 1 drop into both eyes daily as needed (dry eye).       Diagnostic Studies: No results found.  Disposition: 01-Home or Self Care  Discharge Instructions    CPM    Complete by:  As directed   Continuous passive motion machine (CPM):      Use the CPM from 0 to 90 for 6 hours per day.       You may break it up into 2 or 3 sessions per day.      Use CPM for 2 weeks or until you are told to stop.   Call MD / Call 911    Complete by:  As directed   If you experience chest pain or shortness of breath, CALL 911 and be transported to the hospital emergency room.  If you develope a fever above 101 F, pus (white drainage) or increased drainage or redness at the wound, or calf pain, call your surgeon's office.   Change dressing    Complete by:  As directed   Change the gauze dressing daily with sterile 4 x 4 inch gauze and apply TED hose.  DO NOT REMOVE BANDAGE OVER SURGICAL INCISION.  WASH WHOLE LEG INCLUDING OVER THE WATERPROOF BANDAGE WITH SOAP AND WATER EVERY DAY.   Constipation Prevention    Complete by:  As directed   Drink plenty of fluids.  Prune juice may be helpful.  You may use a stool softener, such as Colace (over the counter) 100 mg twice a day.  Use MiraLax (over the counter) for constipation as needed.   Diet - low sodium heart healthy    Complete by:  As directed   Discharge instructions    Complete by:  As directed   INSTRUCTIONS AFTER JOINT REPLACEMENT   Remove items at home which could result in a fall. This includes throw rugs or furniture in walking pathways ICE to the affected joint every three hours while awake for 30 minutes at a time, for at least the first 3-5 days, and then as needed for pain and swelling.  Continue to use ice for pain and swelling. You may notice swelling that will progress down to the foot and ankle.  This is normal after surgery.  Elevate your leg when you are not up walking on it.   Continue  to use the breathing machine you got in the hospital (incentive spirometer) which will help keep your temperature down.  It is common for your temperature to cycle up and down following surgery, especially at night when you are not up moving around and exerting  yourself.  The breathing machine keeps your lungs expanded and your temperature down.   DIET:  As you were doing prior to hospitalization, we recommend a well-balanced diet.  DRESSING / WOUND CARE / SHOWERING  Keep the surgical dressing until follow up.  The dressing is water proof, so you can shower without any extra covering.  IF THE DRESSING FALLS OFF or the wound gets wet inside, change the dressing with sterile gauze.  Please use good hand washing techniques before changing the dressing.  Do not use any lotions or creams on the incision until instructed by your surgeon.    ACTIVITY  Increase activity slowly as tolerated, but follow the weight bearing instructions below.   No driving for 6 weeks or until further direction given by your physician.  You cannot drive while taking narcotics.  No lifting or carrying greater than 10 lbs. until further directed by your surgeon. Avoid periods of inactivity such as sitting longer than an hour when not asleep. This helps prevent blood clots.  You may return to work once you are authorized by your doctor.     WEIGHT BEARING   Weight bearing as tolerated with assist device (walker, cane, etc) as directed, use it as long as suggested by your surgeon or therapist, typically at least 2-3 weeks.   EXERCISES  Results after joint replacement surgery are often greatly improved when you follow the exercise, range of motion and muscle strengthening exercises prescribed by your doctor. Safety measures are also important to protect the joint from further injury. Any time any of these exercises cause you to have increased pain or swelling, decrease what you are doing until you are comfortable again and  then slowly increase them. If you have problems or questions, call your caregiver or physical therapist for advice.   Rehabilitation is important following a joint replacement. After just a few days of immobilization, the muscles of the leg can become weakened and shrink (atrophy).  These exercises are designed to build up the tone and strength of the thigh and leg muscles and to improve motion. Often times heat used for twenty to thirty minutes before working out will loosen up your tissues and help with improving the range of motion but do not use heat for the first two weeks following surgery (sometimes heat can increase post-operative swelling).   These exercises can be done on a training (exercise) mat, on the floor, on a table or on a bed. Use whatever works the best and is most comfortable for you.    Use music or television while you are exercising so that the exercises are a pleasant break in your day. This will make your life better with the exercises acting as a break in your routine that you can look forward to.   Perform all exercises about fifteen times, three times per day or as directed.  You should exercise both the operative leg and the other leg as well.   Exercises include:  Quad Sets - Tighten up the muscle on the front of the thigh (Quad) and hold for 5-10 seconds.   Straight Leg Raises - With your knee straight (if you were given a brace, keep it on), lift the leg to 60 degrees, hold for 3 seconds, and slowly lower the leg.  Perform this exercise against resistance later as your leg gets stronger.  Leg Slides: Lying on your back, slowly slide your foot toward your buttocks, bending your knee up off the floor (only  go as far as is comfortable). Then slowly slide your foot back down until your leg is flat on the floor again.  Angel Wings: Lying on your back spread your legs to the side as far apart as you can without causing discomfort.  Hamstring Strength:  Lying on your back, push  your heel against the floor with your leg straight by tightening up the muscles of your buttocks.  Repeat, but this time bend your knee to a comfortable angle, and push your heel against the floor.  You may put a pillow under the heel to make it more comfortable if necessary.   A rehabilitation program following joint replacement surgery can speed recovery and prevent re-injury in the future due to weakened muscles. Contact your doctor or a physical therapist for more information on knee rehabilitation.    CONSTIPATION  Constipation is defined medically as fewer than three stools per week and severe constipation as less than one stool per week.  Even if you have a regular bowel pattern at home, your normal regimen is likely to be disrupted due to multiple reasons following surgery.  Combination of anesthesia, postoperative narcotics, change in appetite and fluid intake all can affect your bowels.   YOU MUST use at least one of the following options; they are listed in order of increasing strength to get the job done.  They are all available over the counter, and you may need to use some, POSSIBLY even all of these options:    Drink plenty of fluids (prune juice may be helpful) and high fiber foods Colace 100 mg by mouth twice a day  Senokot for constipation as directed and as needed Dulcolax (bisacodyl), take with full glass of water  Miralax (polyethylene glycol) once or twice a day as needed.  If you have tried all these things and are unable to have a bowel movement in the first 3-4 days after surgery call either your surgeon or your primary doctor.    If you experience loose stools or diarrhea, hold the medications until you stool forms back up.  If your symptoms do not get better within 1 week or if they get worse, check with your doctor.  If you experience "the worst abdominal pain ever" or develop nausea or vomiting, please contact the office immediately for further recommendations for  treatment.   ITCHING:  If you experience itching with your medications, try taking only a single pain pill, or even half a pain pill at a time.  You can also use Benadryl over the counter for itching or also to help with sleep.   TED HOSE STOCKINGS:  Use stockings on both legs until for at least 2 weeks or as directed by physician office. They may be removed at night for sleeping.  MEDICATIONS:  See your medication summary on the "After Visit Summary" that nursing will review with you.  You may have some home medications which will be placed on hold until you complete the course of blood thinner medication.  It is important for you to complete the blood thinner medication as prescribed.  PRECAUTIONS:  If you experience chest pain or shortness of breath - call 911 immediately for transfer to the hospital emergency department.   If you develop a fever greater that 101 F, purulent drainage from wound, increased redness or drainage from wound, foul odor from the wound/dressing, or calf pain - CONTACT YOUR SURGEON.  FOLLOW-UP APPOINTMENTS:  If you do not already have a post-op appointment, please call the office for an appointment to be seen by your surgeon.  Guidelines for how soon to be seen are listed in your "After Visit Summary", but are typically between 1-4 weeks after surgery.  OTHER INSTRUCTIONS:   Knee Replacement:  Do not place pillow under knee, focus on keeping the knee straight while resting. CPM instructions: 0-90 degrees, 2 hours in the morning, 2 hours in the afternoon, and 2 hours in the evening. Place foam block, curve side up under heel at all times except when in CPM or when walking.  DO NOT modify, tear, cut, or change the foam block in any way.  MAKE SURE YOU:  Understand these instructions.  Get help right away if you are not doing well or get worse.    Thank you for letting us be a part of your medical care team.  It is a  privilege we respect greatly.  We hope these instructions will help you stay on track for a fast and full recovery!   Do not put a pillow under the knee. Place it under the heel.    Complete by:  As directed   Place gray foam block, curve side up under heel at all times except when in CPM or when walking.  DO NOT modify, tear, cut, or change in any way the gray foam block.   Increase activity slowly as tolerated    Complete by:  As directed   Patient may shower    Complete by:  As directed   Aquacel dressing is water proof    Wash over it and the whole leg with soap and water at the end of your shower   TED hose    Complete by:  As directed   Use stockings (TED hose) for 2 weeks on both leg(s).  You may remove them at night for sleeping.      Follow-up Information    Nilda Simmer, MD Follow up on 02/13/2016.   Specialty:  Orthopedic Surgery Why:  appt time 10:45 am Contact information: [address] [place] [address] [phone]            Signed: Pascal Lux 01/28/2016, 8:54 AM

## 2016-01-28 NOTE — Care Management (Signed)
Patient discharged prior to Case manager speaking with her. Patient was setup with [facility] for therapy. All DME had been delivered to her home.

## 2016-01-28 NOTE — Progress Notes (Signed)
Evelyn Mcintosh to be D/C'd Home per MD order.  Discussed with the patient and all questions fully answered.  VSS, Skin clean, dry and intact without evidence of skin break down, no evidence of skin tears noted. IV catheter discontinued intact. Site without signs and symptoms of complications. Dressing and pressure applied.  An After Visit Summary was printed and given to the patient. Patient received prescription.  D/c education completed with patient/family including follow up instructions, medication list, d/c activities limitations if indicated, with other d/c instructions as indicated by MD - patient able to verbalize understanding, all questions fully answered.   Patient instructed to return to ED, call 911, or call MD for any changes in condition.   Patient escorted via WC, and D/C home via private auto.  [name] 01/28/2016 1:47 PM

## 2016-01-28 NOTE — Progress Notes (Signed)
Orthopedic Tech Progress Note Patient Details:  Evelyn Mcintosh [birth year] [id number]  Patient ID: Evelyn Mcintosh, female   DOB: [birth year], 55 y.o.   MRN: [id number] Applied cpm 0-60  Trinna PostMartinez, Mylik Pro J 01/28/2016, 6:11 AM

## 2016-01-28 NOTE — Progress Notes (Signed)
Physical Therapy Treatment Patient Details Name: Evelyn Mcintosh MRN: [id number] DOB: [birth year] Today's Date: 01/28/2016    History of Present Illness Pt admitted 8/21 for elective R TKA. Pt had previous L TKA 08/2014.    PT Comments    Pt performed gait, transfers and stair training in prep for d/c home today.  Pt required review of knee precautions and HEP.  HEP issued and patient reviewed packet for understanding and continuation at home.  Will continue efforts with mobility pending d/c.     Follow Up Recommendations  Home health PT;Supervision/Assistance - 24 hour     Equipment Recommendations  None recommended by PT    Recommendations for Other Services       Precautions / Restrictions Precautions Precautions: Fall;Knee Precaution Booklet Issued: Yes (comment) Precaution Comments: educated on HEP and not to place anything under knee Required Braces or Orthoses: Knee Immobilizer - Right Knee Immobilizer - Right: On when out of bed or walking Restrictions Weight Bearing Restrictions: Yes RLE Weight Bearing: Weight bearing as tolerated    Mobility  Bed Mobility Overal bed mobility: Needs Assistance Bed Mobility: Sit to Supine     Supine to sit: Supervision     General bed mobility comments: Good technique cues for positioning and LE management.  Transfers Overall transfer level: Needs assistance Equipment used: Rolling walker (2 wheeled) Transfers: Sit to/from Stand Sit to Stand: Supervision         General transfer comment: Pt standing on arrival.  Pt performed stand to sit with good technique.    Ambulation/Gait Ambulation/Gait assistance: Supervision Ambulation Distance (Feet): 180 Feet Assistive device: Rolling walker (2 wheeled) Gait Pattern/deviations: Step-through pattern;Trunk flexed   Gait velocity interpretation: Below normal speed for age/gender General Gait Details: Cues for sequencing, upper trunk control and RW safety.      Stairs Stairs: Yes   Stair Management: One rail Left;Backwards;Forwards;Step to pattern Number of Stairs: 5 General stair comments: Cues for sequencing and hand placement on rail.  Forward to ascend and backwards to descend.    Wheelchair Mobility    Modified Rankin (Stroke Patients Only)       Balance Overall balance assessment: Needs assistance   Sitting balance-Leahy Scale: Good       Standing balance-Leahy Scale: Fair                      Cognition Arousal/Alertness: Awake/alert Behavior During Therapy: WFL for tasks assessed/performed Overall Cognitive Status: Within Functional Limits for tasks assessed                      Exercises Total Joint Exercises Ankle Circles/Pumps: AROM;Both;10 reps;Supine Quad Sets: AROM;Right;10 reps;Supine Short Arc Quad: AROM;Right;10 reps;Supine Heel Slides: AROM;Right;10 reps;Supine Hip ABduction/ADduction: AROM;Right;10 reps;Supine Straight Leg Raises: AROM;Right;10 reps;Supine Long Arc Quad: AROM;Right;10 reps;Seated Goniometric ROM: 83 degrees.      General Comments        Pertinent Vitals/Pain Pain Assessment: 0-10 Pain Score: 5  Pain Location: R knee Pain Descriptors / Indicators: Aching Pain Intervention(s): Monitored during session    Home Living                      Prior Function            PT Goals (current goals can now be found in the care plan section) Acute Rehab PT Goals Patient Stated Goal: walk today Potential to Achieve Goals: Good Progress towards PT goals: Progressing toward  goals    Frequency  7X/week    PT Plan Current plan remains appropriate    Co-evaluation             End of Session Equipment Utilized During Treatment: Gait belt Activity Tolerance: Patient tolerated treatment well Patient left: with call bell/phone within reach;with family/visitor present;in bed     Time: [id number] PT Time Calculation (min) (ACUTE ONLY): 22  min  Charges:  $Gait Training: 8-22 mins                    G Codes:      Evelyn Mcintosh 01/28/2016, 9:24 AM  Evelyn Mcintosh, PTA pager [phone]

## 2016-01-28 NOTE — Evaluation (Signed)
Occupational Therapy Evaluation and Discharge Patient Details Name: Evelyn Mcintosh MRN: [id number] DOB: 07/11/1960 Today's Date: 01/28/2016    History of Present Illness Pt admitted 8/21 for elective R TKA. Pt had previous L TKA 08/2014.   Clinical Impression   Pt performing toileting, standing grooming and bed mobility at a modified independent level. Needs min assist for LB bathing and dressing and is knowledgeable in compensatory strategies and use of AE. Pt declined tub transfer practice, plans to sponge bathe until she feels ready to step over the edge of her tub. Pt educated in home safety and fall prevention. No further OT needs.    Follow Up Recommendations  No OT follow up    Equipment Recommendations  None recommended by OT    Recommendations for Other Services       Precautions / Restrictions Precautions Precautions: Fall;Knee Precaution Booklet Issued: Yes (comment) Precaution Comments: educated on HEP and not to place anything under knee Required Braces or Orthoses: Knee Immobilizer - Right Knee Immobilizer - Right: On when out of bed or walking Restrictions Weight Bearing Restrictions: Yes RLE Weight Bearing: Weight bearing as tolerated      Mobility Bed Mobility Overal bed mobility: Modified Independent           General bed mobility comments: no physical assist in or out of bed  Transfers Overall transfer level: Needs assistance Equipment used: Rolling walker (2 wheeled) Transfers: Sit to/from Stand Sit to Stand: Modified independent (Device/Increase time)         General transfer comment: from bed and 3 in 1 over toilet    Balance Overall balance assessment: Needs assistance   Sitting balance-Leahy Scale: Good       Standing balance-Leahy Scale: Fair                              ADL Overall ADL's : Needs assistance/impaired Eating/Feeding: Independent;Sitting   Grooming: Wash/dry hands;Standing;Modified independent    Upper Body Bathing: Set up;Sitting   Lower Body Bathing: Minimal assistance;Sit to/from stand   Upper Body Dressing : Set up;Sitting   Lower Body Dressing: Minimal assistance;Sit to/from stand   Toilet Transfer: Modified Independent;RW;Ambulation;BSC (over toilet)   Toileting- Clothing Manipulation and Hygiene: Modified independent;Sit to/from Nurse, children'sstand     Tub/Shower Transfer Details (indicate cue type and reason): pt plans to sponge bathe, deferred tub transfer, but educated in technique Functional mobility during ADLs: Supervision/safety;Rolling walker General ADL Comments: Pt educated in compensatory strategies for LB bathing and dressing, safe footwear and transporting items safely with RW.      Vision     Perception     Praxis      Pertinent Vitals/Pain Pain Assessment: Faces Pain Score: 5  Faces Pain Scale: No hurt Pain Location: R knee Pain Descriptors / Indicators: Aching Pain Intervention(s): Ice applied;Premedicated before session     Hand Dominance Right   Extremity/Trunk Assessment Upper Extremity Assessment Upper Extremity Assessment: Overall WFL for tasks assessed   Lower Extremity Assessment Lower Extremity Assessment: Defer to PT evaluation   Cervical / Trunk Assessment Cervical / Trunk Assessment: Normal   Communication Communication Communication: No difficulties   Cognition Arousal/Alertness: Awake/alert Behavior During Therapy: WFL for tasks assessed/performed Overall Cognitive Status: Within Functional Limits for tasks assessed                     General Comments       Exercises  Shoulder Instructions      Home Living Family/patient expects to be discharged to:: Private residence Living Arrangements: Spouse/significant other Available Help at Discharge: Family;Available 24 hours/day Type of Home: House Home Access: Stairs to enter Entergy CorporationEntrance Stairs-Number of Steps: 4 Entrance Stairs-Rails: Left Home Layout: One  level     Bathroom Shower/Tub: Chief Strategy OfficerTub/shower unit   Bathroom Toilet: Standard     Home Equipment: Environmental consultantWalker - 2 wheels;Bedside commode          Prior Functioning/Environment Level of Independence: Independent             OT Diagnosis: Generalized weakness;Acute pain   OT Problem List:     OT Treatment/Interventions:      OT Goals(Current goals can be found in the care plan section) Acute Rehab OT Goals Patient Stated Goal: go home  OT Frequency:     Barriers to D/C:            Co-evaluation              End of Session Equipment Utilized During Treatment: Rolling walker;Gait belt  Activity Tolerance: Patient tolerated treatment well Patient left: in bed;with call bell/phone within reach;with family/visitor present (in bone foam)   Time: 1000-1022 OT Time Calculation (min): 22 min Charges:  OT General Charges $OT Visit: 1 Procedure OT Evaluation $OT Eval Low Complexity: 1 Procedure G-Codes:    Evern BioMayberry, Meerab Maselli Lynn 01/28/2016, 11:15 AM  [phone]

## 2016-05-12 ENCOUNTER — Ambulatory Visit
Admission: RE | Admit: 2016-05-12 | Discharge: 2016-05-12 | Disposition: A | Discharge status: Home / Self Care | Payer: BLUE CROSS/BLUE SHIELD | Source: Ambulatory Visit | Attending: Orthopedic Surgery | Admitting: Orthopedic Surgery

## 2016-05-12 ENCOUNTER — Other Ambulatory Visit: Payer: Self-pay | Admitting: Orthopedic Surgery

## 2016-05-12 DIAGNOSIS — M7989 Other specified soft tissue disorders: Secondary | ICD-10-CM | POA: Diagnosis not present

## 2016-05-12 DIAGNOSIS — M79604 Pain in right leg: Secondary | ICD-10-CM

## 2016-07-16 ENCOUNTER — Other Ambulatory Visit: Payer: Self-pay | Admitting: Family Medicine

## 2016-07-16 DIAGNOSIS — Z1231 Encounter for screening mammogram for malignant neoplasm of breast: Secondary | ICD-10-CM

## 2016-08-14 ENCOUNTER — Ambulatory Visit
Admission: RE | Admit: 2016-08-14 | Discharge: 2016-08-14 | Disposition: A | Discharge status: Home / Self Care | Payer: BLUE CROSS/BLUE SHIELD | Source: Ambulatory Visit | Attending: Family Medicine | Admitting: Family Medicine

## 2016-08-14 DIAGNOSIS — Z1231 Encounter for screening mammogram for malignant neoplasm of breast: Secondary | ICD-10-CM | POA: Diagnosis not present

## 2017-01-22 IMAGING — CR DG KNEE 1-2V*R*
1 series · 2 of 2 positions shown · non-contrast
Comparison: 05/08/2014.

CLINICAL DATA: Pain.

EXAM:
RIGHT KNEE - 1-2 VIEW

[Series 1: dg knee 1-2 views right · 0.14mm/px · 2 of 2 slices shown]
[im 1/2]
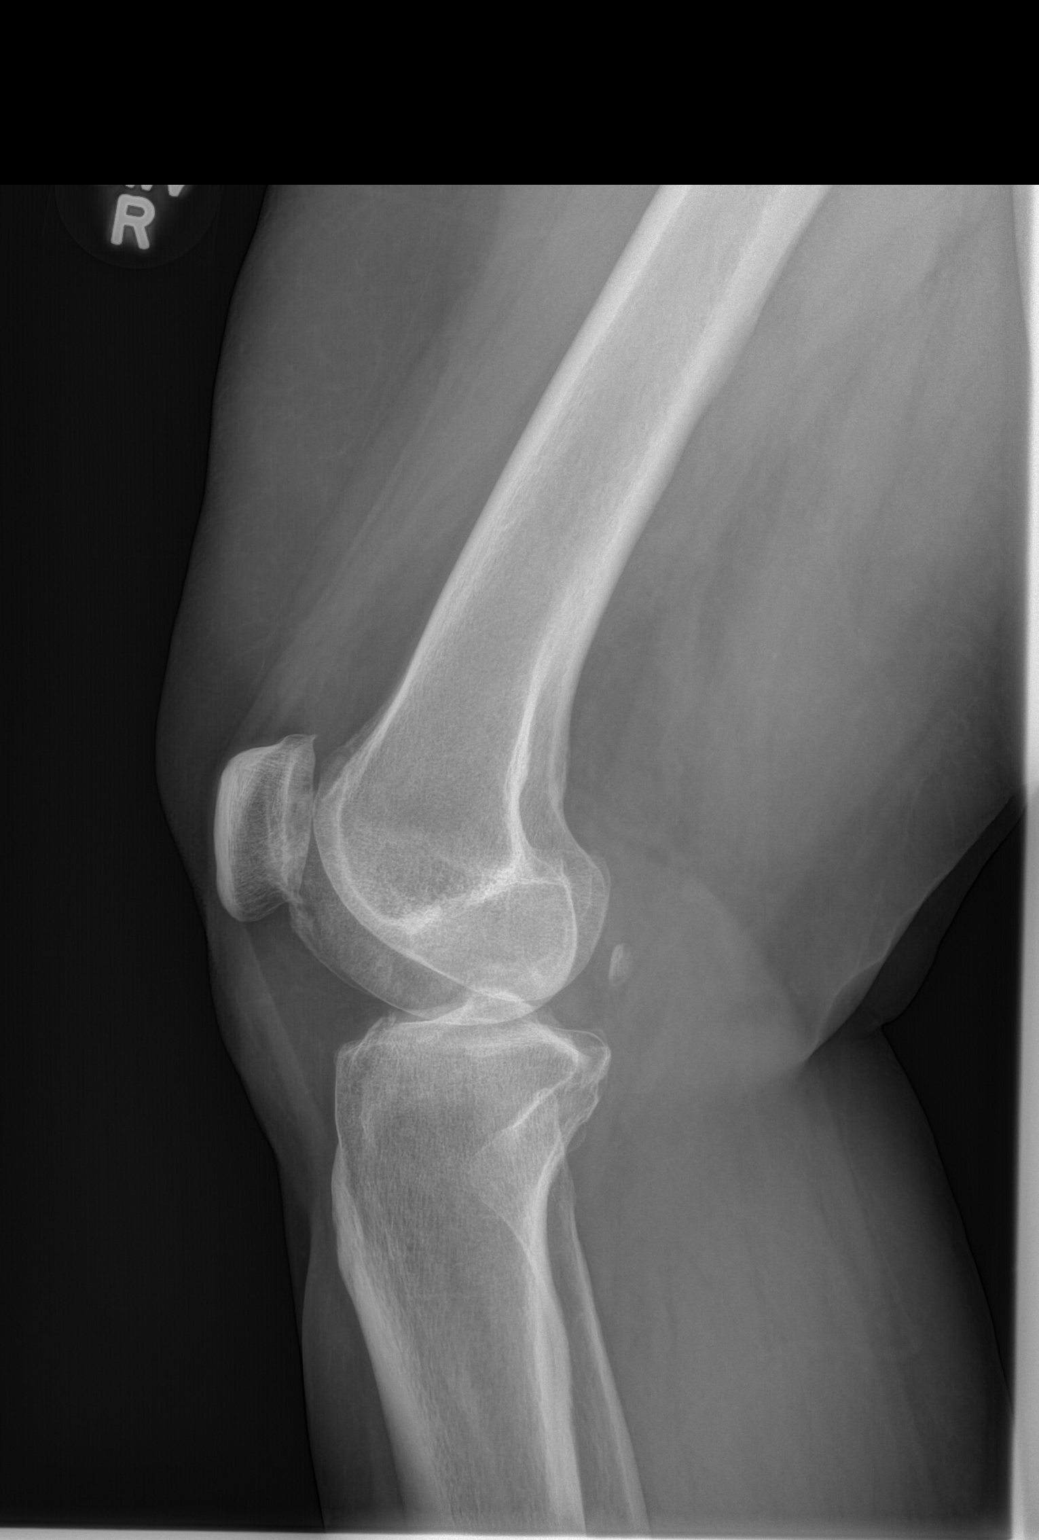
[im 2/2]
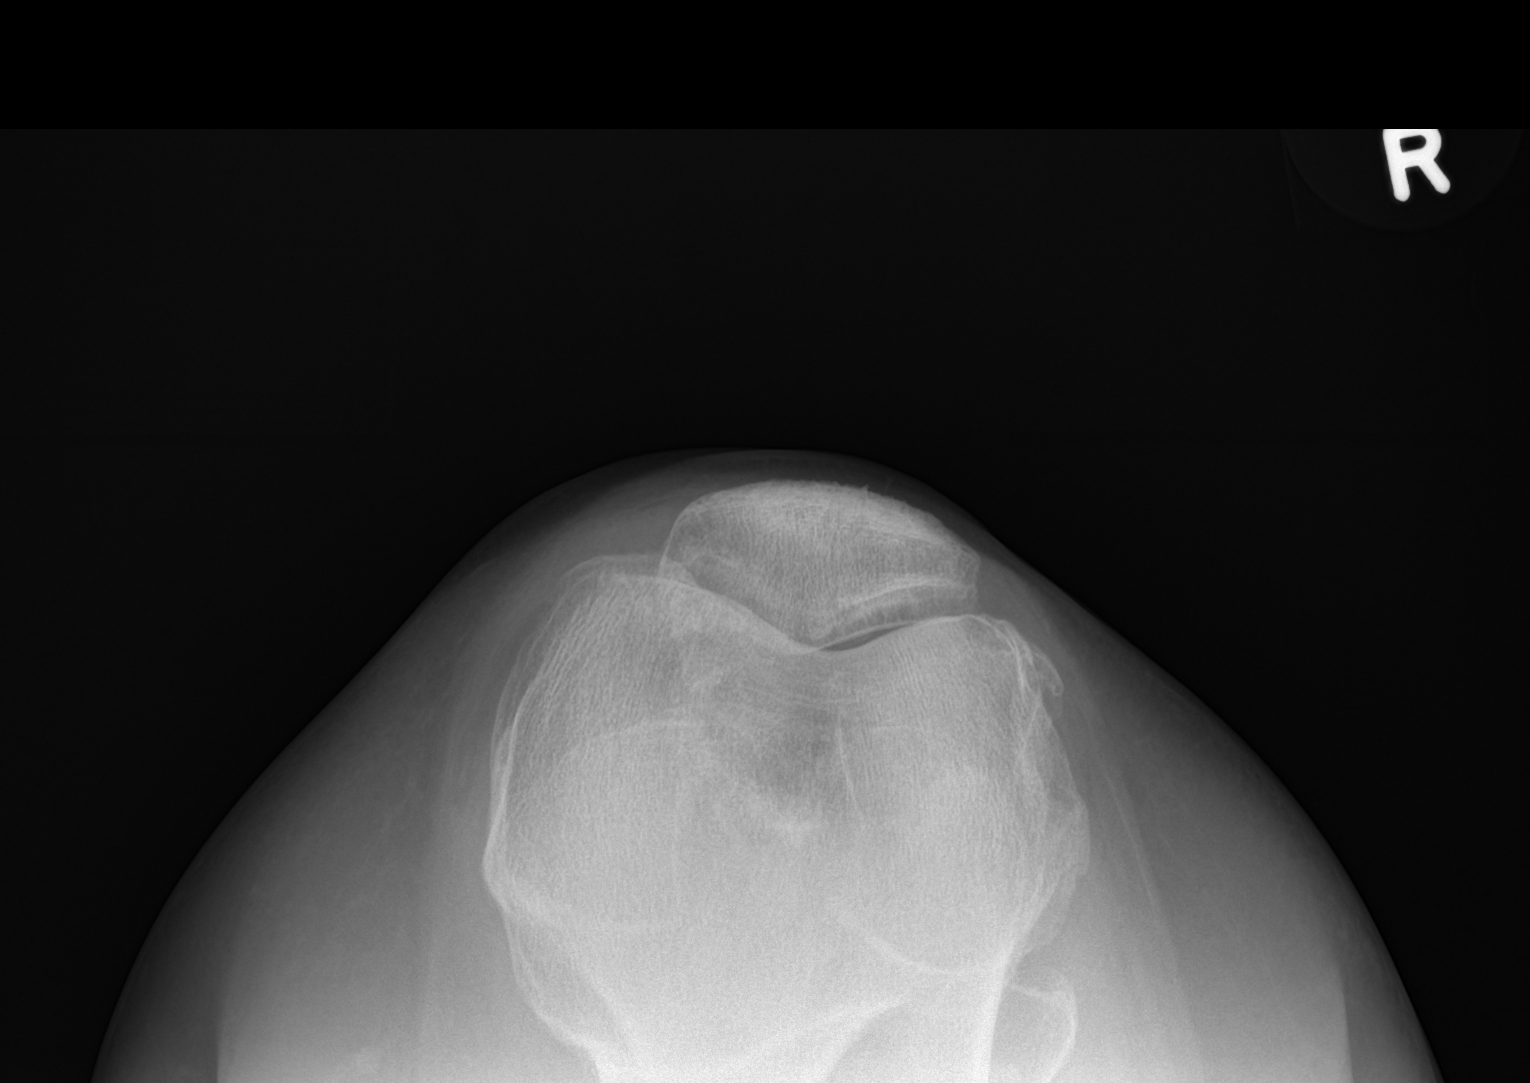

[2 of 2 positions shown; findings below may reference images not displayed]

FINDINGS: Two views study shows no fracture. No worrisome lytic or sclerotic
osseous abnormality. No evidence for joint effusion. Spurring noted
in the patellofemoral compartment.
IMPRESSION: Degenerative change without acute bony findings.

## 2018-01-31 ENCOUNTER — Other Ambulatory Visit: Payer: Self-pay | Admitting: Family Medicine

## 2019-03-17 ENCOUNTER — Other Ambulatory Visit: Payer: Self-pay

## 2019-03-17 DIAGNOSIS — Z20822 Contact with and (suspected) exposure to covid-19: Secondary | ICD-10-CM

## 2019-03-18 LAB — NOVEL CORONAVIRUS, NAA: SARS-CoV-2, NAA: NOT DETECTED

## 2019-04-04 ENCOUNTER — Other Ambulatory Visit: Payer: Self-pay | Admitting: Family Medicine

## 2019-04-04 DIAGNOSIS — Z1231 Encounter for screening mammogram for malignant neoplasm of breast: Secondary | ICD-10-CM

## 2019-05-10 ENCOUNTER — Ambulatory Visit
Admission: RE | Admit: 2019-05-10 | Discharge: 2019-05-10 | Disposition: A | Discharge status: Home / Self Care | Payer: BC Managed Care – PPO | Source: Ambulatory Visit | Attending: Family Medicine | Admitting: Family Medicine

## 2019-05-10 DIAGNOSIS — Z1231 Encounter for screening mammogram for malignant neoplasm of breast: Secondary | ICD-10-CM

## 2020-12-13 IMAGING — MG DIGITAL SCREENING BILAT W/ TOMO W/ CAD
8 series · 8 of 24 positions shown · non-contrast
Comparison: Previous exam(s).

CLINICAL DATA: Screening.

EXAM:
DIGITAL SCREENING BILATERAL MAMMOGRAM WITH TOMO AND CAD

[R MLO synth-2D]
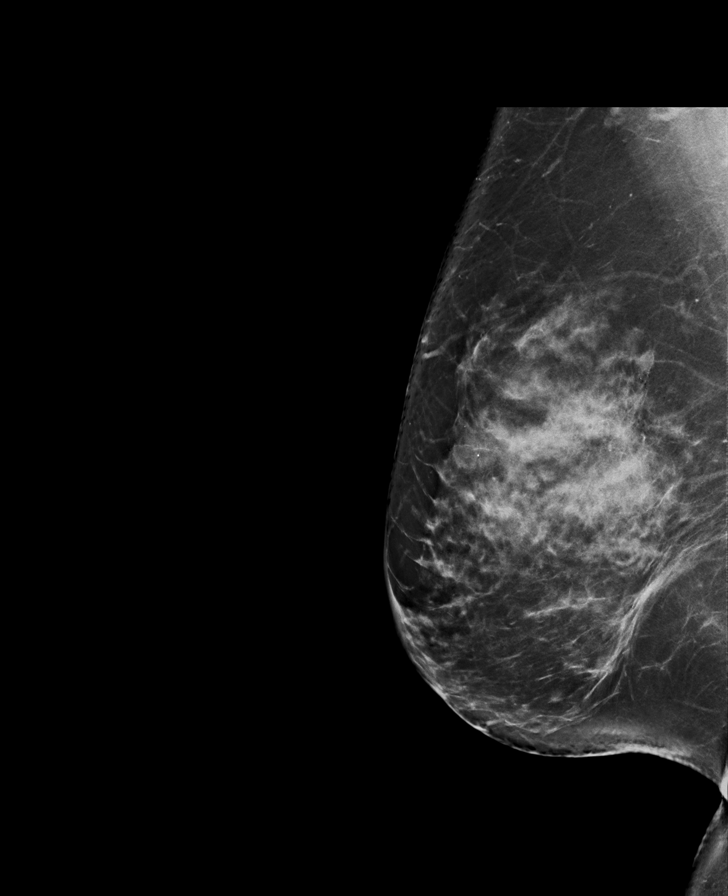

[R CC synth-2D]
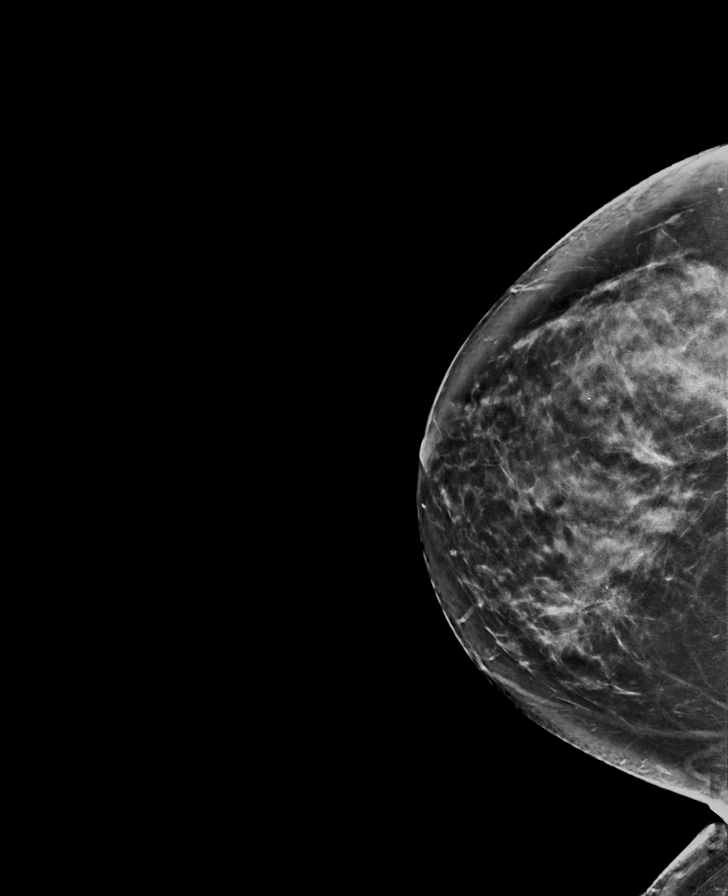

[L CC synth-2D]
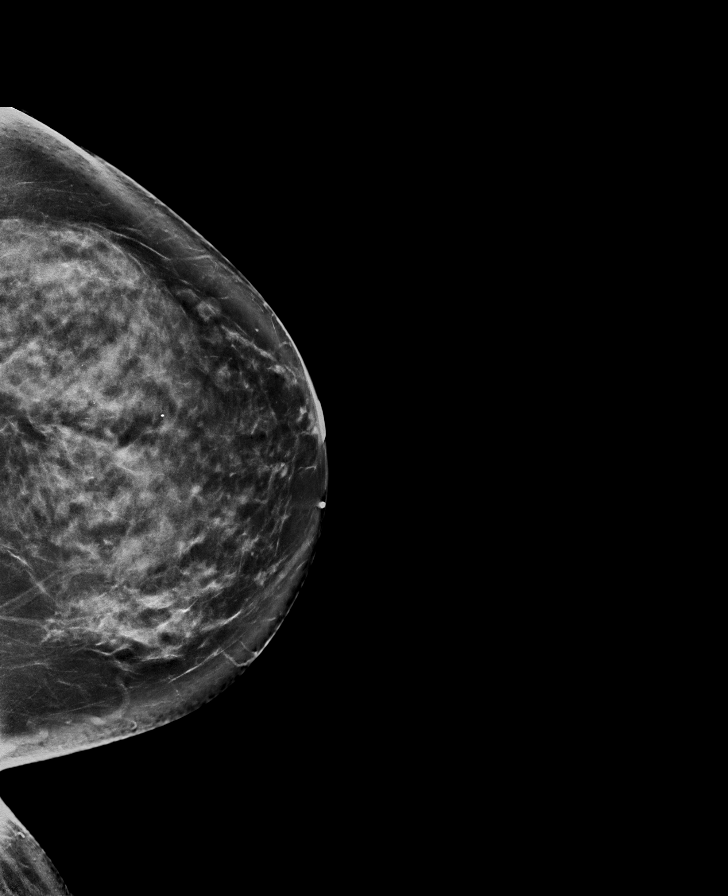

[L MLO synth-2D]
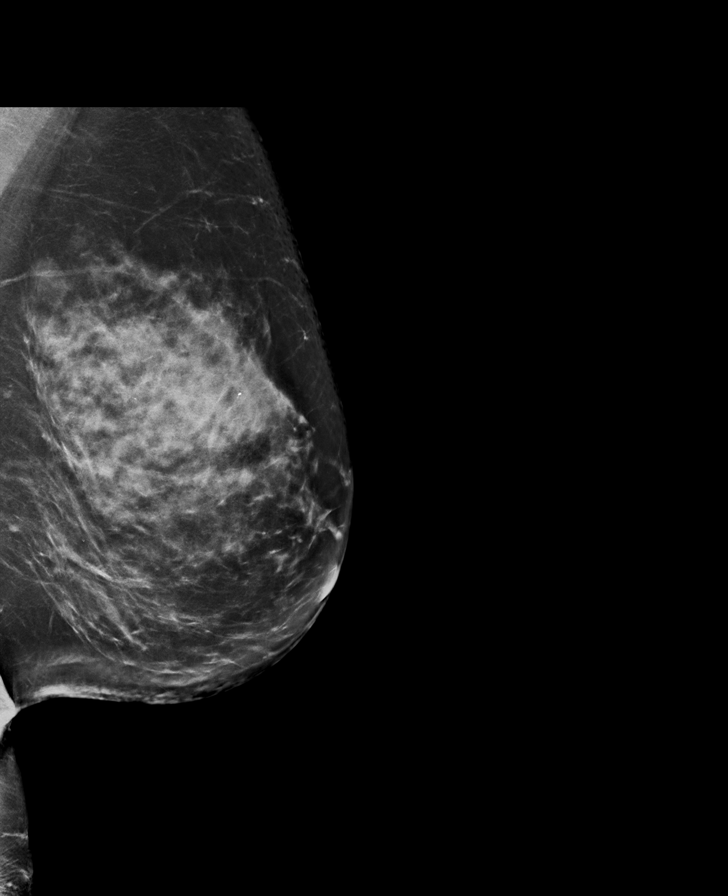

[L CC tomo · tomo slice 47/94.0]
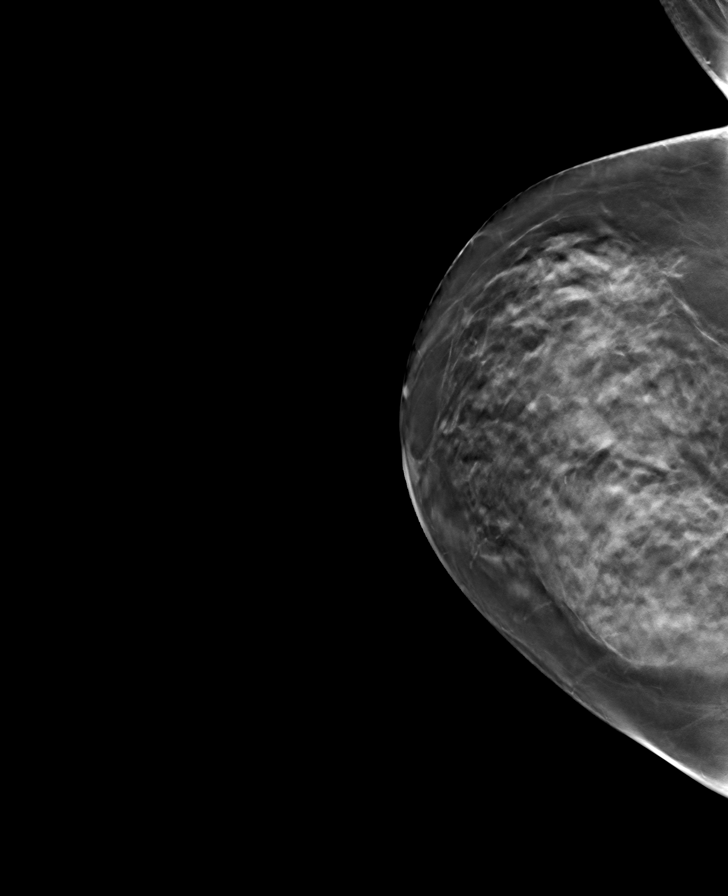

[L MLO tomo · tomo slice 49/98.0]
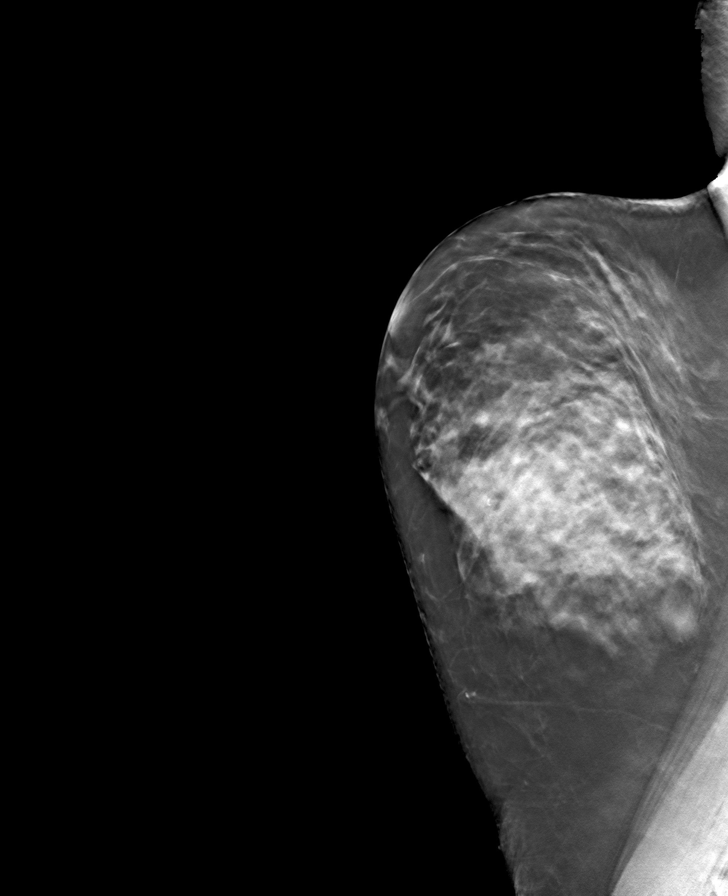

[R MLO tomo · tomo slice 50/99.0]
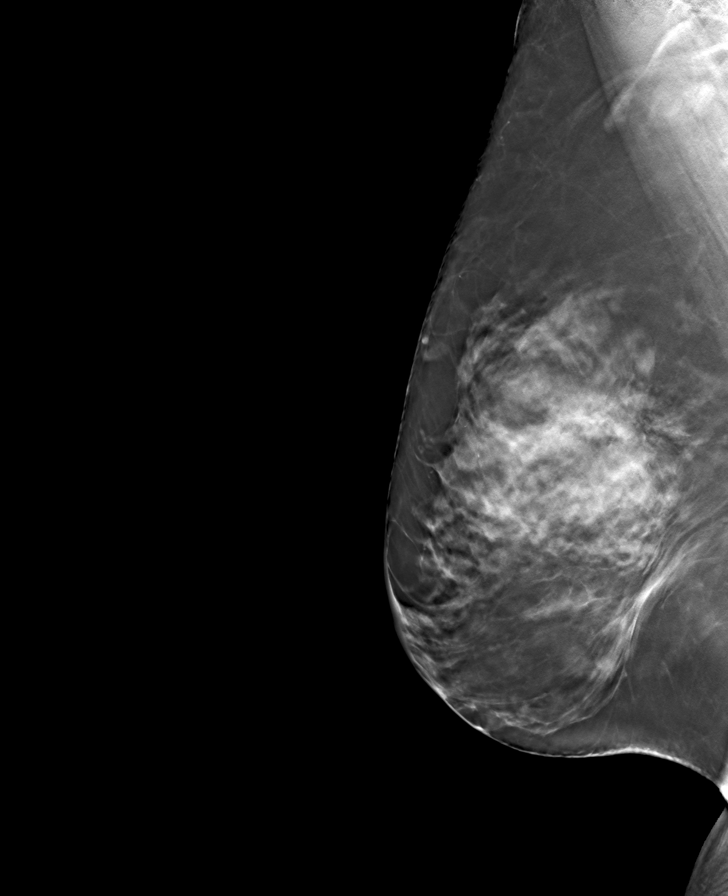

[R CC tomo · tomo slice 47/93.0]
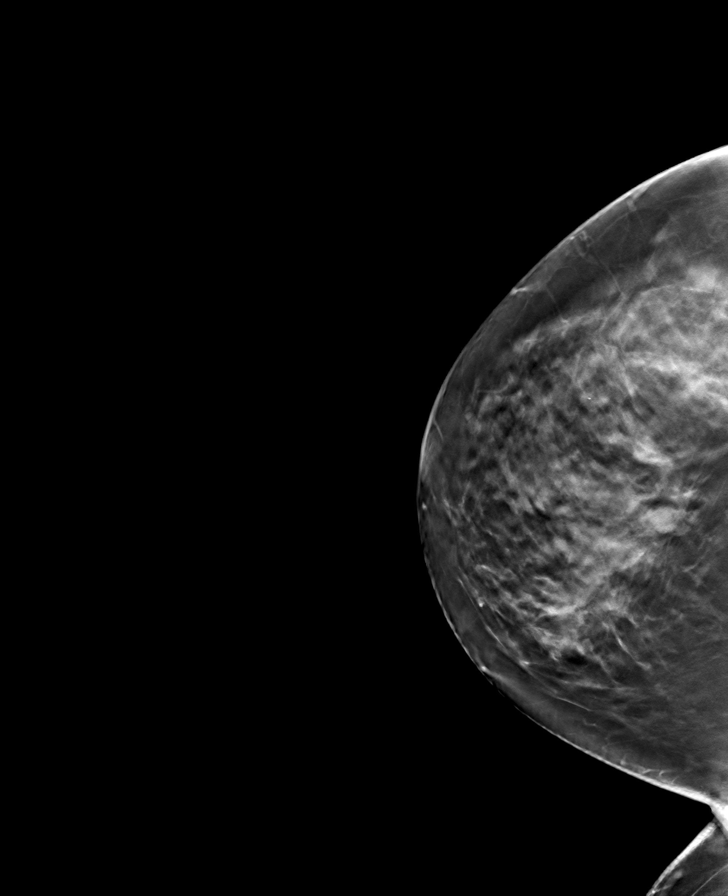

[8 of 24 positions shown; findings below may reference images not displayed]

ACR Breast Density Category c: The breast tissue is heterogeneously
dense, which may obscure small masses.
FINDINGS: There are no findings suspicious for malignancy. Images were
processed with CAD.
IMPRESSION: No mammographic evidence of malignancy. A result letter of this
screening mammogram will be mailed directly to the patient.

RECOMMENDATION:
Screening mammogram in one year. (Code:[id number])

BI-RADS CATEGORY  1: Negative.

## 2021-02-12 ENCOUNTER — Other Ambulatory Visit (INDEPENDENT_AMBULATORY_CARE_PROVIDER_SITE_OTHER): Payer: Self-pay

## 2021-02-12 DIAGNOSIS — Z1211 Encounter for screening for malignant neoplasm of colon: Secondary | ICD-10-CM

## 2021-02-12 MED ORDER — NA SULFATE-K SULFATE-MG SULF 17.5-3.13-1.6 GM/177ML PO SOLN: 1.0000 | Freq: Once | ORAL | 0 refills | Status: AC

## 2021-02-12 NOTE — Progress Notes (Signed)
Gastroenterology Pre-Procedure Review  Request Date: 04/07/2021 Requesting Physician: Dr. Vicente Males  PATIENT REVIEW QUESTIONS: The patient responded to the following health history questions as indicated:    1. Are you having any GI issues? no 2. Do you have a personal history of Polyps?  No 3. Do you have a family history of Colon Cancer or Polyps? no 4. Diabetes Mellitus? no 5. Joint replacements in the past 12 months?no 6. Major health problems in the past 3 months?no 7. Any artificial heart valves, MVP, or defibrillator?no    MEDICATIONS & ALLERGIES:    Patient reports the following regarding taking any anticoagulation/antiplatelet therapy:   Plavix, Coumadin, Eliquis, Xarelto, Lovenox, Pradaxa, Brilinta, or Effient? no Aspirin? no  Patient confirms/reports the following medications:  Current Outpatient Medications  Medication Sig Dispense Refill   Na Sulfate-K Sulfate-Mg Sulf 17.5-3.13-1.6 GM/177ML SOLN Take 1 kit by mouth once for 1 dose. 354 mL 0   acetaminophen (TYLENOL) 325 MG tablet Take 2 tablets (650 mg total) by mouth every 6 (six) hours as needed for mild pain (or Fever >/= 101).     albuterol (PROVENTIL HFA;VENTOLIN HFA) 108 (90 Base) MCG/ACT inhaler Inhale 2 puffs into the lungs every 6 (six) hours as needed.     apixaban (ELIQUIS) 2.5 MG TABS tablet 1 tablet twice a day to prevent blood clots.  When finished taking this medication start taking 81 mg aspirin daily 60 tablet 0   celecoxib (CELEBREX) 200 MG capsule 1 tab po q day with food for pain and  swelling 30 capsule 3   docusate sodium (COLACE) 100 MG capsule 1 tab 2 times a day while on narcotics.  STOOL SOFTENER 60 capsule 0   omeprazole (PRILOSEC) 40 MG capsule Take 40 mg by mouth daily.     oxyCODONE (OXY IR/ROXICODONE) 5 MG immediate release tablet 1-2 tablets every 3 hrs as needed for breakthrough pain.  SHORT ACTING PAIN MEDICATION 100 tablet 0   oxyCODONE (OXYCONTIN) 20 mg 12 hr tablet 1 tablet every 12 hours   LONG ACTING PAIN MEDICATION 30 tablet 0   polyethylene glycol (MIRALAX / GLYCOLAX) packet 17grams in 16 oz of water twice a day until bowel movement.  LAXITIVE.  Restart if two days since last bowel movement 14 each 0   XIIDRA 5 % SOLN Place 1 drop into both eyes daily as needed (dry eye).      No current facility-administered medications for this visit.    Patient confirms/reports the following allergies:  Allergies  Allergen Reactions   No Known Allergies     No orders of the defined types were placed in this encounter.   AUTHORIZATION INFORMATION Primary Insurance: 1D#: Group #:  Secondary Insurance: 1D#: Group #:  SCHEDULE INFORMATION: Date: 04/07/21 Time: Location: [facility]

## 2021-04-04 ENCOUNTER — Encounter: Payer: Self-pay | Admitting: Gastroenterology

## 2021-04-07 ENCOUNTER — Ambulatory Visit
Admission: RE | Admit: 2021-04-07 | Payer: BC Managed Care – PPO | Source: Home / Self Care | Admitting: Gastroenterology

## 2021-04-07 ENCOUNTER — Encounter: Admission: RE | Payer: Self-pay | Source: Home / Self Care

## 2021-04-07 SURGERY — COLONOSCOPY WITH PROPOFOL
Anesthesia: General

## 2021-05-12 ENCOUNTER — Other Ambulatory Visit: Payer: Self-pay

## 2021-05-12 DIAGNOSIS — Z1231 Encounter for screening mammogram for malignant neoplasm of breast: Secondary | ICD-10-CM

## 2021-07-07 ENCOUNTER — Other Ambulatory Visit: Payer: Self-pay | Admitting: Family Medicine

## 2021-07-07 DIAGNOSIS — Z1231 Encounter for screening mammogram for malignant neoplasm of breast: Secondary | ICD-10-CM

## 2021-08-13 ENCOUNTER — Other Ambulatory Visit: Payer: Self-pay

## 2021-08-13 ENCOUNTER — Ambulatory Visit
Admission: RE | Admit: 2021-08-13 | Discharge: 2021-08-13 | Disposition: A | Discharge status: Home / Self Care | Payer: BC Managed Care – PPO | Source: Ambulatory Visit | Attending: Family Medicine | Admitting: Family Medicine

## 2021-08-13 DIAGNOSIS — Z1231 Encounter for screening mammogram for malignant neoplasm of breast: Secondary | ICD-10-CM | POA: Diagnosis not present

## 2022-08-13 ENCOUNTER — Other Ambulatory Visit: Payer: Self-pay

## 2022-08-13 DIAGNOSIS — Z1231 Encounter for screening mammogram for malignant neoplasm of breast: Secondary | ICD-10-CM

## 2022-08-28 ENCOUNTER — Ambulatory Visit
Admission: RE | Admit: 2022-08-28 | Discharge: 2022-08-28 | Disposition: A | Discharge status: Home / Self Care | Payer: BC Managed Care – PPO | Source: Ambulatory Visit | Attending: Family Medicine | Admitting: Family Medicine

## 2022-08-28 DIAGNOSIS — Z1231 Encounter for screening mammogram for malignant neoplasm of breast: Secondary | ICD-10-CM | POA: Insufficient documentation

## 2023-05-17 ENCOUNTER — Other Ambulatory Visit: Payer: Self-pay

## 2023-05-17 DIAGNOSIS — J45909 Unspecified asthma, uncomplicated: Secondary | ICD-10-CM | POA: Insufficient documentation

## 2023-05-17 DIAGNOSIS — K3580 Unspecified acute appendicitis: Secondary | ICD-10-CM | POA: Diagnosis not present

## 2023-05-17 DIAGNOSIS — Z96653 Presence of artificial knee joint, bilateral: Secondary | ICD-10-CM | POA: Insufficient documentation

## 2023-05-17 DIAGNOSIS — Z96651 Presence of right artificial knee joint: Secondary | ICD-10-CM | POA: Insufficient documentation

## 2023-05-17 DIAGNOSIS — R1031 Right lower quadrant pain: Secondary | ICD-10-CM | POA: Diagnosis present

## 2023-05-17 DIAGNOSIS — Z79899 Other long term (current) drug therapy: Secondary | ICD-10-CM | POA: Insufficient documentation

## 2023-05-17 LAB — COMPREHENSIVE METABOLIC PANEL
ALT: 28 U/L (ref 0–44)
AST: 23 U/L (ref 15–41)
Albumin: 4.5 g/dL (ref 3.5–5.0)
Alkaline Phosphatase: 77 U/L (ref 38–126)
Anion gap: 8 (ref 5–15)
BUN: 17 mg/dL (ref 8–23)
CO2: 28 mmol/L (ref 22–32)
Calcium: 8.9 mg/dL (ref 8.9–10.3)
Chloride: 100 mmol/L (ref 98–111)
Creatinine, Ser: 0.67 mg/dL (ref 0.44–1.00)
GFR, Estimated: >60 mL/min (ref >60)
Glucose, Bld: 170 mg/dL — ABNORMAL HIGH (ref 70–99)
Potassium: 3.5 mmol/L (ref 3.5–5.1)
Sodium: 136 mmol/L (ref 135–145)
Total Bilirubin: 0.5 mg/dL (ref <1.2)
Total Protein: 7.3 g/dL (ref 6.5–8.1)

## 2023-05-17 LAB — LIPASE, BLOOD: Lipase: 25 U/L (ref 11–51)

## 2023-05-17 LAB — CBC
HCT: 41.2 % (ref 36.0–46.0)
Hemoglobin: 14.2 g/dL (ref 12.0–15.0)
MCH: 29.8 pg (ref 26.0–34.0)
MCHC: 34.5 g/dL (ref 30.0–36.0)
MCV: 86.6 fL (ref 80.0–100.0)
Platelets: 406 10*3/uL — ABNORMAL HIGH (ref 150–400)
RBC: 4.76 MIL/uL (ref 3.87–5.11)
RDW: 13.2 % (ref 11.5–15.5)
WBC: 12.5 10*3/uL — ABNORMAL HIGH (ref 4.0–10.5)
nRBC: 0 % (ref 0.0–0.2)

## 2023-05-17 NOTE — ED Triage Notes (Signed)
Pt to ED via POV c/o RLQ abd pain that started around 3pm today. Pain described as a stabbing pain. Pt also endorses some vomiting. Pt still has appendix. Denies CP, SOB, fevers

## 2023-05-18 ENCOUNTER — Encounter: Payer: Self-pay | Admitting: General Surgery

## 2023-05-18 ENCOUNTER — Emergency Department: Payer: BC Managed Care – PPO

## 2023-05-18 ENCOUNTER — Observation Stay: Payer: BC Managed Care – PPO | Admitting: Anesthesiology

## 2023-05-18 ENCOUNTER — Other Ambulatory Visit: Payer: Self-pay

## 2023-05-18 ENCOUNTER — Observation Stay
Admission: EM | Admit: 2023-05-18 | Discharge: 2023-05-18 | Disposition: A | Discharge status: Home / Self Care | Payer: BC Managed Care – PPO | Attending: General Surgery | Admitting: General Surgery

## 2023-05-18 ENCOUNTER — Encounter
Admission: EM | Disposition: A | Discharge status: Home / Self Care | Payer: Self-pay | Source: Home / Self Care | Attending: Emergency Medicine

## 2023-05-18 DIAGNOSIS — K3580 Unspecified acute appendicitis: Principal | ICD-10-CM | POA: Diagnosis present

## 2023-05-18 HISTORY — PX: LAPAROSCOPIC APPENDECTOMY: SHX408

## 2023-05-18 LAB — URINALYSIS, ROUTINE W REFLEX MICROSCOPIC
Bacteria, UA: NONE SEEN
Bilirubin Urine: NEGATIVE
Glucose, UA: NEGATIVE mg/dL
Hgb urine dipstick: NEGATIVE
Ketones, ur: NEGATIVE mg/dL
Leukocytes,Ua: NEGATIVE
Nitrite: NEGATIVE
Protein, ur: NEGATIVE mg/dL
RBC / HPF: 0 RBC/hpf (ref 0–5)
Specific Gravity, Urine: >1.046 — ABNORMAL HIGH (ref 1.005–1.030)
Squamous Epithelial / HPF: 0 /[HPF] (ref 0–5)
pH: 5 (ref 5.0–8.0)

## 2023-05-18 SURGERY — APPENDECTOMY, LAPAROSCOPIC
Anesthesia: General | Site: Abdomen

## 2023-05-18 MED ORDER — PIPERACILLIN-TAZOBACTAM 3.375 G IVPB 30 MIN
3.3750 g | Freq: Once | INTRAVENOUS | Status: AC
Start: 2023-05-18 — End: 2023-05-18
  Administered 2023-05-18: 3.375 g via INTRAVENOUS
  Filled 2023-05-18 (×2): qty 50

## 2023-05-18 MED ORDER — SODIUM CHLORIDE 0.9 % IV BOLUS (SEPSIS)
1000.0000 mL | Freq: Once | INTRAVENOUS | Status: AC
  Administered 2023-05-18: 1000 mL via INTRAVENOUS

## 2023-05-18 MED ORDER — FENTANYL CITRATE (PF) 100 MCG/2ML IJ SOLN
INTRAMUSCULAR | Status: AC
  Filled 2023-05-18: qty 2

## 2023-05-18 MED ORDER — PIPERACILLIN-TAZOBACTAM 3.375 G IVPB: 3.3750 g | Freq: Three times a day (TID) | INTRAVENOUS | Status: DC

## 2023-05-18 MED ORDER — BUPIVACAINE-EPINEPHRINE 0.5% -1:200000 IJ SOLN
INTRAMUSCULAR | Status: DC | PRN
  Administered 2023-05-18: 10 mL

## 2023-05-18 MED ORDER — SUCCINYLCHOLINE CHLORIDE 200 MG/10ML IV SOSY
PREFILLED_SYRINGE | INTRAVENOUS | Status: DC | PRN
  Administered 2023-05-18: 140 mg via INTRAVENOUS

## 2023-05-18 MED ORDER — LACTATED RINGERS IV SOLN: INTRAVENOUS | Status: DC

## 2023-05-18 MED ORDER — OXYCODONE HCL 5 MG PO TABS
5.0000 mg | ORAL_TABLET | Freq: Once | ORAL | Status: AC | PRN
  Administered 2023-05-18: 5 mg via ORAL

## 2023-05-18 MED ORDER — ALBUTEROL SULFATE HFA 108 (90 BASE) MCG/ACT IN AERS
INHALATION_SPRAY | RESPIRATORY_TRACT | Status: DC | PRN
  Administered 2023-05-18: 4 via RESPIRATORY_TRACT

## 2023-05-18 MED ORDER — ONDANSETRON HCL 4 MG/2ML IJ SOLN
INTRAMUSCULAR | Status: DC | PRN
  Administered 2023-05-18: 4 mg via INTRAVENOUS

## 2023-05-18 MED ORDER — ROCURONIUM BROMIDE 100 MG/10ML IV SOLN
INTRAVENOUS | Status: DC | PRN
  Administered 2023-05-18: 50 mg via INTRAVENOUS

## 2023-05-18 MED ORDER — ONDANSETRON HCL 4 MG/2ML IJ SOLN: 4.0000 mg | Freq: Four times a day (QID) | INTRAMUSCULAR | Status: DC | PRN

## 2023-05-18 MED ORDER — OXYCODONE HCL 5 MG PO TABS: 5.0000 mg | ORAL_TABLET | Freq: Four times a day (QID) | ORAL | 0 refills | Status: DC | PRN

## 2023-05-18 MED ORDER — BUPIVACAINE LIPOSOME 1.3 % IJ SUSP
INTRAMUSCULAR | Status: AC
  Filled 2023-05-18: qty 10

## 2023-05-18 MED ORDER — ONDANSETRON HCL 4 MG/2ML IJ SOLN
INTRAMUSCULAR | Status: AC
  Filled 2023-05-18: qty 2

## 2023-05-18 MED ORDER — FENTANYL CITRATE (PF) 100 MCG/2ML IJ SOLN
INTRAMUSCULAR | Status: DC | PRN
  Administered 2023-05-18: 50 ug via INTRAVENOUS
  Administered 2023-05-18: 100 ug via INTRAVENOUS
  Administered 2023-05-18: 50 ug via INTRAVENOUS

## 2023-05-18 MED ORDER — MIDAZOLAM HCL 2 MG/2ML IJ SOLN
INTRAMUSCULAR | Status: DC | PRN
  Administered 2023-05-18: 2 mg via INTRAVENOUS

## 2023-05-18 MED ORDER — LIDOCAINE HCL (PF) 2 % IJ SOLN
INTRAMUSCULAR | Status: AC
Start: 2023-05-18 — End: ?
  Filled 2023-05-18: qty 5

## 2023-05-18 MED ORDER — OXYCODONE HCL 5 MG PO TABS
ORAL_TABLET | ORAL | Status: AC
  Filled 2023-05-18: qty 1

## 2023-05-18 MED ORDER — BUPIVACAINE-EPINEPHRINE (PF) 0.5% -1:200000 IJ SOLN
INTRAMUSCULAR | Status: AC
  Filled 2023-05-18: qty 30

## 2023-05-18 MED ORDER — ACETAMINOPHEN 500 MG PO TABS
1000.0000 mg | ORAL_TABLET | Freq: Four times a day (QID) | ORAL | Status: DC
  Administered 2023-05-18: 1000 mg via ORAL
  Filled 2023-05-18: qty 2

## 2023-05-18 MED ORDER — KETOROLAC TROMETHAMINE 30 MG/ML IJ SOLN
INTRAMUSCULAR | Status: DC | PRN
  Administered 2023-05-18: 30 mg via INTRAVENOUS

## 2023-05-18 MED ORDER — OXYCODONE HCL 5 MG PO TABS: 5.0000 mg | ORAL_TABLET | ORAL | Status: DC | PRN

## 2023-05-18 MED ORDER — ONDANSETRON 4 MG PO TBDP: 4.0000 mg | ORAL_TABLET | Freq: Four times a day (QID) | ORAL | Status: DC | PRN

## 2023-05-18 MED ORDER — DEXAMETHASONE SODIUM PHOSPHATE 10 MG/ML IJ SOLN
INTRAMUSCULAR | Status: DC | PRN
  Administered 2023-05-18: 10 mg via INTRAVENOUS

## 2023-05-18 MED ORDER — DEXAMETHASONE SODIUM PHOSPHATE 10 MG/ML IJ SOLN
INTRAMUSCULAR | Status: AC
  Filled 2023-05-18: qty 1

## 2023-05-18 MED ORDER — MORPHINE SULFATE (PF) 4 MG/ML IV SOLN
4.0000 mg | Freq: Once | INTRAVENOUS | Status: DC
  Filled 2023-05-18: qty 1

## 2023-05-18 MED ORDER — IOHEXOL 350 MG/ML SOLN
100.0000 mL | Freq: Once | INTRAVENOUS | Status: AC | PRN
  Administered 2023-05-18: 100 mL via INTRAVENOUS

## 2023-05-18 MED ORDER — ONDANSETRON HCL 4 MG/2ML IJ SOLN
4.0000 mg | Freq: Once | INTRAMUSCULAR | Status: DC
  Filled 2023-05-18: qty 2

## 2023-05-18 MED ORDER — PROPOFOL 10 MG/ML IV BOLUS
INTRAVENOUS | Status: DC | PRN
  Administered 2023-05-18: 200 mg via INTRAVENOUS

## 2023-05-18 MED ORDER — FENTANYL CITRATE (PF) 100 MCG/2ML IJ SOLN: 25.0000 ug | INTRAMUSCULAR | Status: DC | PRN

## 2023-05-18 MED ORDER — OXYCODONE HCL 5 MG/5ML PO SOLN: 5.0000 mg | Freq: Once | ORAL | Status: AC | PRN

## 2023-05-18 MED ORDER — CEFAZOLIN SODIUM-DEXTROSE 2-3 GM-%(50ML) IV SOLR
INTRAVENOUS | Status: DC | PRN
  Administered 2023-05-18: 2 g via INTRAVENOUS

## 2023-05-18 MED ORDER — PROPOFOL 1000 MG/100ML IV EMUL
INTRAVENOUS | Status: AC
  Filled 2023-05-18: qty 100

## 2023-05-18 MED ORDER — BUPIVACAINE LIPOSOME 1.3 % IJ SUSP
INTRAMUSCULAR | Status: DC | PRN
  Administered 2023-05-18: 10 mL

## 2023-05-18 MED ORDER — SUGAMMADEX SODIUM 200 MG/2ML IV SOLN
INTRAVENOUS | Status: DC | PRN
  Administered 2023-05-18: 400 mg via INTRAVENOUS

## 2023-05-18 MED ORDER — LIDOCAINE HCL (CARDIAC) PF 100 MG/5ML IV SOSY
PREFILLED_SYRINGE | INTRAVENOUS | Status: DC | PRN
  Administered 2023-05-18: 100 mg via INTRAVENOUS

## 2023-05-18 MED ORDER — MIDAZOLAM HCL 2 MG/2ML IJ SOLN
INTRAMUSCULAR | Status: AC
  Filled 2023-05-18: qty 2

## 2023-05-18 SURGICAL SUPPLY — 34 items
CUTTER FLEX LINEAR 45M (STAPLE) IMPLANT
DERMABOND ADVANCED .7 DNX12 (GAUZE/BANDAGES/DRESSINGS) ×1 IMPLANT
ELECT REM PT RETURN 9FT ADLT (ELECTROSURGICAL) ×1
ELECTRODE REM PT RTRN 9FT ADLT (ELECTROSURGICAL) ×1 IMPLANT
GLOVE BIOGEL PI IND STRL 7.5 (GLOVE) ×1 IMPLANT
GLOVE SURG SYN 7.0 (GLOVE) ×1 IMPLANT
GLOVE SURG SYN 7.0 PF PI (GLOVE) ×1 IMPLANT
GOWN STRL REUS W/ TWL LRG LVL3 (GOWN DISPOSABLE) ×2 IMPLANT
GRASPER SUT TROCAR 14GX15 (MISCELLANEOUS) ×1 IMPLANT
IRRIGATION STRYKERFLOW (MISCELLANEOUS) IMPLANT
IRRIGATOR STRYKERFLOW (MISCELLANEOUS)
KIT PINK PAD W/HEAD ARE REST (MISCELLANEOUS) ×1 IMPLANT
KIT PINK PAD W/HEAD ARM REST (MISCELLANEOUS) ×1 IMPLANT
KIT TURNOVER KIT A (KITS) ×1 IMPLANT
NDL HYPO 22X1.5 SAFETY MO (MISCELLANEOUS) ×1 IMPLANT
NDL INSUFFLATION 14GA 120MM (NEEDLE) ×1 IMPLANT
NEEDLE HYPO 22X1.5 SAFETY MO (MISCELLANEOUS) ×1 IMPLANT
NEEDLE INSUFFLATION 14GA 120MM (NEEDLE) ×1 IMPLANT
NS IRRIG 500ML POUR BTL (IV SOLUTION) ×1 IMPLANT
PACK LAP CHOLECYSTECTOMY (MISCELLANEOUS) ×1 IMPLANT
RELOAD 45 VASCULAR/THIN (ENDOMECHANICALS) ×1 IMPLANT
RELOAD STAPLE 45 2.5 WHT GRN (ENDOMECHANICALS) IMPLANT
RELOAD STAPLE 45 3.5 BLU ETS (ENDOMECHANICALS) IMPLANT
RELOAD STAPLE TA45 3.5 REG BLU (ENDOMECHANICALS) ×1 IMPLANT
SET TUBE SMOKE EVAC HIGH FLOW (TUBING) ×1 IMPLANT
SLEEVE Z-THREAD 5X100MM (TROCAR) ×1 IMPLANT
SUT MNCRL AB 4-0 PS2 18 (SUTURE) ×1 IMPLANT
SUT VICRYL 0 AB UR-6 (SUTURE) ×1 IMPLANT
SYS BAG RETRIEVAL 10MM (BASKET) ×1
SYSTEM BAG RETRIEVAL 10MM (BASKET) ×1 IMPLANT
TRAP FLUID SMOKE EVACUATOR (MISCELLANEOUS) ×1 IMPLANT
TRAY FOLEY MTR SLVR 16FR STAT (SET/KITS/TRAYS/PACK) ×1 IMPLANT
TROCAR Z-THREAD FIOS 12X100MM (TROCAR) ×1 IMPLANT
TROCAR Z-THREAD FIOS 5X100MM (TROCAR) ×1 IMPLANT

## 2023-05-18 NOTE — ED Notes (Signed)
Pharmacy contacted for zosyn

## 2023-05-18 NOTE — Anesthesia Preprocedure Evaluation (Signed)
Anesthesia Evaluation  Patient identified by MRN, date of birth, ID band Patient awake    Reviewed: Allergy & Precautions, NPO status , Patient's Chart, lab work & pertinent test results  History of Anesthesia Complications Negative for: history of anesthetic complications  Airway Mallampati: III  TM Distance: >3 FB Neck ROM: full    Dental  (+) Chipped   Pulmonary neg shortness of breath, asthma    Pulmonary exam normal        Cardiovascular Exercise Tolerance: Good (-) angina (-) Past MI and (-) DOE negative cardio ROS Normal cardiovascular exam     Neuro/Psych negative neurological ROS  negative psych ROS   GI/Hepatic Neg liver ROS,GERD  Controlled,,  Endo/Other  negative endocrine ROS    Renal/GU      Musculoskeletal   Abdominal   Peds  Hematology negative hematology ROS (+)   Anesthesia Other Findings Past Medical History: No date: Arthritis No date: Asthma No date: GERD (gastroesophageal reflux disease) No date: History of blood transfusion 05/2014: Lumbar pain with radiation down left leg No date: Primary localized osteoarthritis of right knee 08/20/2014: S/P left total knee replacement using cement  Past Surgical History: 2013: BREAST BIOPSY; Left     Comment:  benign No date: BREAST SURGERY; Left     Comment:  BENIGN LUMPS No date: COLONOSCOPY 05/24/2009: KNEE ARTHROSCOPY W/ MENISCECTOMY; Left 08/20/2014: TOTAL KNEE ARTHROPLASTY; Left     Comment:  Procedure: TOTAL KNEE ARTHROPLASTY;  Surgeon: Salvatore Marvel, MD;  Location: [facility] OR;  Service: Orthopedics;                Laterality: Left; 01/27/2016: TOTAL KNEE ARTHROPLASTY; Right 01/27/2016: TOTAL KNEE ARTHROPLASTY; Right     Comment:  Procedure: RIGHT TOTAL KNEE ARTHROPLASTY;  Surgeon:               Salvatore Marvel, MD;  Location: [facility] OR;  Service:               Orthopedics;  Laterality: Right; No date: UPPER GI ENDOSCOPY      Comment:  x 2  BMI    Body Mass Index: 37.86 kg/m      Reproductive/Obstetrics negative OB ROS                             Anesthesia Physical Anesthesia Plan  ASA: 3  Anesthesia Plan: General ETT   Post-op Pain Management:    Induction: Intravenous  PONV Risk Score and Plan: Ondansetron, Dexamethasone, Midazolam and Treatment may vary due to age or medical condition  Airway Management Planned: Oral ETT  Additional Equipment:   Intra-op Plan:   Post-operative Plan: Extubation in OR  Informed Consent: I have reviewed the patients History and Physical, chart, labs and discussed the procedure including the risks, benefits and alternatives for the proposed anesthesia with the patient or authorized representative who has indicated his/her understanding and acceptance.     Dental Advisory Given  Plan Discussed with: Anesthesiologist, CRNA and Surgeon  Anesthesia Plan Comments: (Patient consented for risks of anesthesia including but not limited to:  - adverse reactions to medications - damage to eyes, teeth, lips or other oral mucosa - nerve damage due to positioning  - sore throat or hoarseness - Damage to heart, brain, nerves, lungs, other parts of body or loss of life  Patient voiced understanding and assent.)  Anesthesia Quick Evaluation

## 2023-05-18 NOTE — Anesthesia Postprocedure Evaluation (Signed)
Anesthesia Post Note  Patient: Evelyn Mcintosh  Procedure(s) Performed: APPENDECTOMY LAPAROSCOPIC (Abdomen)  Patient location during evaluation: PACU Anesthesia Type: General Level of consciousness: awake and alert Pain management: pain level controlled Vital Signs Assessment: post-procedure vital signs reviewed and stable Respiratory status: spontaneous breathing, nonlabored ventilation, respiratory function stable and patient connected to nasal cannula oxygen Cardiovascular status: blood pressure returned to baseline and stable Postop Assessment: no apparent nausea or vomiting Anesthetic complications: no   No notable events documented.   Last Vitals:  Vitals:   05/18/23 1330 05/18/23 1349  BP: (!) 157/70 (!) 146/64  Pulse: 77 72  Resp: 12 15  Temp: 36.6 C 37.2 C  SpO2: 95% 96%    Last Pain:  Vitals:   05/18/23 1349  TempSrc: Temporal  PainSc: 2                  Cleda Mccreedy Deetra Booton

## 2023-05-18 NOTE — Anesthesia Procedure Notes (Signed)
Procedure Name: Intubation Date/Time: 05/18/2023 12:03 PM  Performed by: Ginger Carne, CRNAPre-anesthesia Checklist: Patient identified, Emergency Drugs available, Suction available, Patient being monitored and Timeout performed Patient Re-evaluated:Patient Re-evaluated prior to induction Oxygen Delivery Method: Circle system utilized Preoxygenation: Pre-oxygenation with 100% oxygen Induction Type: IV induction and Rapid sequence Laryngoscope Size: McGrath and 3 Grade View: Grade I Tube type: Oral Tube size: 7.0 mm Number of attempts: 1 Airway Equipment and Method: Stylet and Video-laryngoscopy Placement Confirmation: ETT inserted through vocal cords under direct vision, positive ETCO2 and breath sounds checked- equal and bilateral Secured at: 18 cm Tube secured with: Tape Dental Injury: Teeth and Oropharynx as per pre-operative assessment

## 2023-05-18 NOTE — ED Notes (Signed)
Pt declined nausea med and pain med currently. Dr Elesa Massed notified

## 2023-05-18 NOTE — H&P (Signed)
Patient ID: Evelyn Mcintosh, female   DOB: July 03, 1960, 62 y.o.   MRN: [id number] CC: Acute Appendicitis History of Present Illness Evelyn Mcintosh is a 62 y.o. female with past medical history as listed below who presents in consultation for acute appendicitis.  The patient reports that approximately 3 PM yesterday she developed cramping, abdominal pain.  She said that this was throughout her abdomen.  She initially thought that she had some digestion problems.  However, the pain persisted and this was associated with 1 episode of emesis.  The pain became more sharp in nature so she presented to the emergency department.  She says that now the pain has migrated to the right lower quadrant.  She denies having any chills or fevers.  She has thrown up once or twice since the pain started.  She denies any diarrhea.  In the ED she had a CT scan that showed inflammation around the appendix concerning for acute appendicitis and she also had a leukocytosis.  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       Past Surgical History:  Procedure Laterality Date   BREAST BIOPSY Left 2013   benign   BREAST SURGERY Left    BENIGN LUMPS   COLONOSCOPY     KNEE ARTHROSCOPY W/ MENISCECTOMY Left 05/24/2009   TOTAL KNEE ARTHROPLASTY Left 08/20/2014   Procedure: TOTAL KNEE ARTHROPLASTY;  Surgeon: Salvatore Marvel, MD;  Location: [facility] OR;  Service: Orthopedics;  Laterality: Left;   TOTAL KNEE ARTHROPLASTY Right 01/27/2016   TOTAL KNEE ARTHROPLASTY Right 01/27/2016   Procedure: RIGHT TOTAL KNEE ARTHROPLASTY;  Surgeon: Salvatore Marvel, MD;  Location: [facility] OR;  Service: Orthopedics;  Laterality: Right;   UPPER GI ENDOSCOPY     x 2    Allergies  Allergen  Reactions   No Known Allergies     Current Facility-Administered Medications  Medication Dose Route Frequency Provider Last Rate Last Admin   acetaminophen (TYLENOL) tablet 1,000 mg  1,000 mg Oral Q6H Kandis Cocking, MD       lactated ringers infusion   Intravenous Continuous Kandis Cocking, MD 40 mL/hr at 05/18/23 0856 New Bag at 05/18/23 0856   morphine (PF) 4 MG/ML injection 4 mg  4 mg Intravenous Once Ward, Kristen N, DO       ondansetron (ZOFRAN) injection 4 mg  4 mg Intravenous Once Ward, Kristen N, DO       ondansetron (ZOFRAN-ODT) disintegrating tablet 4 mg  4 mg Oral Q6H PRN Kandis Cocking, MD       Or   ondansetron [facility]) injection 4 mg  4 mg Intravenous Q6H PRN Kandis Cocking, MD       oxyCODONE (Oxy IR/ROXICODONE) immediate release tablet 5-10 mg  5-10 mg Oral Q4H PRN Kandis Cocking, MD       piperacillin-tazobactam (ZOSYN) IVPB 3.375 g  3.375 g Intravenous Q8H Kandis Cocking, MD       Current Outpatient Medications  Medication Sig Dispense Refill   acetaminophen (TYLENOL) 325 MG tablet Take 2 tablets (650 mg total) by mouth every 6 (six) hours as needed for mild pain (or Fever >/= 101).     albuterol (  PROVENTIL HFA;VENTOLIN HFA) 108 (90 Base) MCG/ACT inhaler Inhale 2 puffs into the lungs every 6 (six) hours as needed.     apixaban (ELIQUIS) 2.5 MG TABS tablet 1 tablet twice a day to prevent blood clots.  When finished taking this medication start taking 81 mg aspirin daily 60 tablet 0   celecoxib (CELEBREX) 200 MG capsule 1 tab po q day with food for pain and  swelling 30 capsule 3   docusate sodium (COLACE) 100 MG capsule 1 tab 2 times a day while on narcotics.  STOOL SOFTENER 60 capsule 0   omeprazole (PRILOSEC) 40 MG capsule Take 40 mg by mouth daily.     oxyCODONE (OXY IR/ROXICODONE) 5 MG immediate release tablet 1-2 tablets every 3 hrs as needed for breakthrough pain.  SHORT ACTING PAIN MEDICATION 100 tablet 0   oxyCODONE (OXYCONTIN) 20 mg 12 hr tablet 1  tablet every 12 hours  LONG ACTING PAIN MEDICATION 30 tablet 0   polyethylene glycol (MIRALAX / GLYCOLAX) packet 17grams in 16 oz of water twice a day until bowel movement.  LAXITIVE.  Restart if two days since last bowel movement 14 each 0   XIIDRA 5 % SOLN Place 1 drop into both eyes daily as needed (dry eye).       Family History Family History  Problem Relation Age of Onset   Atrial fibrillation Mother    Alzheimer's disease Mother    Hypertension Father    Stroke Father    Arthritis Other    Diabetes Other    Breast cancer Neg Hx        Social History Social History   Tobacco Use   Smoking status: Never   Smokeless tobacco: Never  Substance Use Topics   Alcohol use: No    Alcohol/week: 0.0 standard drinks of alcohol   Drug use: No       ROS Full ROS of systems performed and is otherwise negative there than what is stated in the HPI  Physical Exam Blood pressure (!) 171/77, pulse 87, temperature 98.3 F (36.8 C), temperature source Oral, resp. rate 16, height 5\' 3"  (1.6 m), weight 93.9 kg, SpO2 99%.  No acute Distress, PERRLA, normal work of breathing on room air, regular rate and rhythm, alert and oriented x 3 moving all extremities spontaneously  Abdomen is soft.  She has a positive Rovsing sign.  She does have focal tenderness in the right lower quadrant with out any guarding but with some rebound tenderness.  Data Reviewed Dependently reviewed her labs.  She has a leukocytosis of 12,000.  I also independently reviewed her CT scan.  There is inflammation in the right lower quadrant around the appendix.  Her appendix appears to be thickened.  There is no evidence of abscess formation.  I have personally reviewed the patient's imaging and medical records.    Assessment/Plan    [name] is a 62 year old female who had 1 day history of abdominal pain that migrated to the right lower quadrant.  She has a CT scan concerning for acute appendicitis and a  leukocytosis.  I discussed with her the options of treating her acute appendicitis including surgical or antibiotics.  I discussed with her that if treating with antibiotics and there is a risk of recurrence.  She has opted to proceed to the operating room for a laparoscopic appendectomy.  This was after I discussed the risk, benefits alternatives of the procedure including risk of infection, bleeding, damage to the small bowel  or large bowel.  I also discussed with her that if there is signs of perforation this increases her risk of intra-abdominal infection.  Continue n.p.o. and on antibiotics.    Kandis Cocking 05/18/2023, 9:19 AM

## 2023-05-18 NOTE — ED Provider Notes (Signed)
[facility] Provider Note    Event Date/Time   First MD Initiated Contact with Patient 05/18/23 0340     (approximate)   History   Abdominal Pain   HPI  Evelyn Mcintosh is a 62 y.o. female with history of asthma, arthritis, GERD who presents to the emergency department with complaints of right lower quadrant pain with nausea and vomiting.  No diarrhea.  No dysuria, hematuria, vaginal bleeding or discharge.No fever.  Pain started yesterday afternoon.  No previous abdominal surgery.  She is worried she has appendicitis.    History provided by patient, husband.    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    Past Surgical History:  Procedure Laterality Date   BREAST BIOPSY Left 2013   benign   BREAST SURGERY Left    BENIGN LUMPS   COLONOSCOPY     KNEE ARTHROSCOPY W/ MENISCECTOMY Left 05/24/2009   TOTAL KNEE ARTHROPLASTY Left 08/20/2014   Procedure: TOTAL KNEE ARTHROPLASTY;  Surgeon: Salvatore Marvel, MD;  Location: [facility] OR;  Service: Orthopedics;  Laterality: Left;   TOTAL KNEE ARTHROPLASTY Right 01/27/2016   TOTAL KNEE ARTHROPLASTY Right 01/27/2016   Procedure: RIGHT TOTAL KNEE ARTHROPLASTY;  Surgeon: Salvatore Marvel, MD;  Location: [facility] OR;  Service: Orthopedics;  Laterality: Right;   UPPER GI ENDOSCOPY     x 2    MEDICATIONS:  Prior to Admission medications   Medication Sig Start Date End Date Taking? Authorizing Provider  acetaminophen (TYLENOL) 325 MG tablet Take 2 tablets (650 mg total) by mouth every 6 (six) hours as needed for mild pain (or Fever >/= 101). 08/21/14   Shepperson, Kirstin, PA-C  albuterol (PROVENTIL HFA;VENTOLIN HFA) 108 (90 Base) MCG/ACT inhaler Inhale 2 puffs into the lungs every 6  (six) hours as needed. 11/26/15   [provider]  apixaban (ELIQUIS) 2.5 MG TABS tablet 1 tablet twice a day to prevent blood clots.  When finished taking this medication start taking 81 mg aspirin daily 01/28/16   Shepperson, Kirstin, PA-C  celecoxib (CELEBREX) 200 MG capsule 1 tab po q day with food for pain and  swelling 01/28/16   Shepperson, Kirstin, PA-C  docusate sodium (COLACE) 100 MG capsule 1 tab 2 times a day while on narcotics.  STOOL SOFTENER 01/28/16   Shepperson, Kirstin, PA-C  omeprazole (PRILOSEC) 40 MG capsule Take 40 mg by mouth daily.    [provider]  oxyCODONE (OXY IR/ROXICODONE) 5 MG immediate release tablet 1-2 tablets every 3 hrs as needed for breakthrough pain.  SHORT ACTING PAIN MEDICATION 01/28/16   Shepperson, Kirstin, PA-C  oxyCODONE (OXYCONTIN) 20 mg 12 hr tablet 1 tablet every 12 hours  LONG ACTING PAIN MEDICATION 01/28/16   Shepperson, Kirstin, PA-C  polyethylene glycol (MIRALAX / GLYCOLAX) packet 17grams in 16 oz of water twice a day until bowel movement.  LAXITIVE.  Restart if two days since last bowel movement 01/28/16   Shepperson, Kirstin, PA-C  XIIDRA 5 % SOLN Place 1 drop into both eyes daily as needed (dry eye).  11/05/15   [provider]    Physical Exam   Triage Vital Signs: ED Triage Vitals  Encounter Vitals Group  BP 05/17/23 2316 (!) 172/90     Systolic BP Percentile --      Diastolic BP Percentile --      Pulse Rate 05/17/23 2316 (!) 102     Resp 05/17/23 2316 18     Temp 05/17/23 2316 99 F (37.2 C)     Temp Source 05/17/23 2316 Oral     SpO2 05/17/23 2316 97 %     Weight 05/17/23 2317 207 lb (93.9 kg)     Height 05/17/23 2317 5\' 3"  (1.6 m)     Head Circumference --      Peak Flow --      Pain Score 05/17/23 2317 3     Pain Loc --      Pain Education --      Exclude from Growth Chart --     Most recent vital signs: Vitals:   05/17/23 2316 05/18/23 0445  BP: (!) 172/90 (!) 171/77  Pulse: (!) 102 87  Resp:  18 16  Temp: 99 F (37.2 C) 98.3 F (36.8 C)  SpO2: 97% 99%    CONSTITUTIONAL: Alert, responds appropriately to questions. Well-appearing; well-nourished HEAD: Normocephalic, atraumatic EYES: Conjunctivae clear, pupils appear equal, sclera nonicteric ENT: normal nose; moist mucous membranes NECK: Supple, normal ROM CARD: RRR; S1 and S2 appreciated RESP: Normal chest excursion without splinting or tachypnea; breath sounds clear and equal bilaterally; no wheezes, no rhonchi, no rales, no hypoxia or respiratory distress, speaking full sentences ABD/GI: Non-distended; soft, non-tender, tender to palpation at McBurney's point with voluntary guarding, no rebound BACK: The back appears normal EXT: Normal ROM in all joints; no deformity noted, no edema SKIN: Normal color for age and race; warm; no rash on exposed skin NEURO: Moves all extremities equally, normal speech PSYCH: The patient's mood and manner are appropriate.   ED Results / Procedures / Treatments   LABS: (all labs ordered are listed, but only abnormal results are displayed) Labs Reviewed  COMPREHENSIVE METABOLIC PANEL - Abnormal; Notable for the following components:      Result Value   Glucose, Bld 170 (*)    All other components within normal limits  CBC - Abnormal; Notable for the following components:   WBC 12.5 (*)    Platelets 406 (*)    All other components within normal limits  LIPASE, BLOOD  URINALYSIS, ROUTINE W REFLEX MICROSCOPIC  HIV ANTIBODY (ROUTINE TESTING W REFLEX)     EKG:   RADIOLOGY: My personal review and interpretation of imaging: CT shows acute appendicitis.  I have personally reviewed all radiology reports.   CT ABDOMEN PELVIS W CONTRAST  Result Date: 05/18/2023 CLINICAL DATA:  Right lower quadrant abdominal pain EXAM: CT ABDOMEN AND PELVIS WITH CONTRAST TECHNIQUE: Multidetector CT imaging of the abdomen and pelvis was performed using the standard protocol following bolus administration  of intravenous contrast. RADIATION DOSE REDUCTION: This exam was performed according to the departmental dose-optimization program which includes automated exposure control, adjustment of the mA and/or kV according to patient size and/or use of iterative reconstruction technique. CONTRAST:  OMNIPAQUE IOHEXOL 350 MG/ML SOLN COMPARISON:  09/30/2020 FINDINGS: Lower chest:  No contributory findings. Hepatobiliary: No focal liver abnormality.No evidence of biliary obstruction or stone. Pancreas: Unremarkable. Spleen: Unremarkable. Adrenals/Urinary Tract: Negative adrenals. No hydronephrosis or stone. Unremarkable bladder. Stomach/Bowel: The appendix is in typical location in the right lower quadrant, extending inferior and posterior to the cecum and shows thick wall with mesoappendiceal fat stranding and 12 mm outer wall diameter. Inferiorly  there are 2 outpouchings from the appendix attributed to small contained perforations measuring 6 mm. No abscess or free pneumoperitoneum. No appendicoliths. Vascular/Lymphatic: No acute vascular abnormality. Atheromatous calcification of the aorta. No mass or adenopathy. Reproductive:Fibroid uterus with an exophytic fundal fibroid measuring 2 cm. Other: No ascites or pneumoperitoneum. Musculoskeletal: No acute abnormalities. Generalized spinal degeneration with mild scoliosis. Left hip replacement with uncomplicated appearance. Prelim sent in epic chat. IMPRESSION: Acute, suppurative appendicitis. Small outpouchings inferiorly from the appendix from wall breakdown. No abscess or pneumoperitoneum. Electronically Signed   By: Tiburcio Pea M.D.   On: 05/18/2023 04:35     PROCEDURES:  Critical Care performed: No      Procedures    IMPRESSION / MDM / ASSESSMENT AND PLAN / ED COURSE  I reviewed the triage vital signs and the nursing notes.    Patient here with right lower quadrant abdominal pain, concern for appendicitis.     DIFFERENTIAL DIAGNOSIS  (includes but not limited to):   Appendicitis, UTI, kidney stone, pyelonephritis, colitis, bowel obstruction   Patient's presentation is most consistent with acute presentation with potential threat to life or bodily function.   PLAN: Labs obtained from triage show leukocytosis of 12,000.  Normal electrolytes, LFTs and lipase.  Will obtain CT abdomen pelvis, urine.  Will give IV fluids, pain and nausea medicine.   MEDICATIONS GIVEN IN ED: Medications  morphine (PF) 4 MG/ML injection 4 mg (4 mg Intravenous Patient Refused/Not Given 05/18/23 0416)  ondansetron (ZOFRAN) injection 4 mg (4 mg Intravenous Patient Refused/Not Given 05/18/23 0416)  lactated ringers infusion (has no administration in time range)  piperacillin-tazobactam (ZOSYN) IVPB 3.375 g (has no administration in time range)  acetaminophen (TYLENOL) tablet 1,000 mg (has no administration in time range)  oxyCODONE (Oxy IR/ROXICODONE) immediate release tablet 5-10 mg (has no administration in time range)  ondansetron (ZOFRAN-ODT) disintegrating tablet 4 mg (has no administration in time range)    Or  ondansetron (ZOFRAN) injection 4 mg (has no administration in time range)  sodium chloride 0.9 % bolus 1,000 mL (0 mLs Intravenous Stopped 05/18/23 0517)  iohexol (OMNIPAQUE) 350 MG/ML injection 100 mL (100 mLs Intravenous Contrast Given 05/18/23 0418)  piperacillin-tazobactam (ZOSYN) IVPB 3.375 g (0 g Intravenous Stopped 05/18/23 0518)     ED COURSE: CT scan reviewed and interpreted by myself and the radiologist and shows appendicitis.  No abscess but there are 2 small contained perforations.  Will give IV Zosyn.  She has a nonperitoneal abdomen.  Will discuss with general surgery.   CONSULTS: Discussed with Dr. Maurine Minister with general surgery.  He will admit patient.  Appreciate his help.   OUTSIDE RECORDS REVIEWED: Reviewed last family medicine note on 03/22/2023.       FINAL CLINICAL IMPRESSION(S) / ED DIAGNOSES   Final  diagnoses:  Acute appendicitis, unspecified acute appendicitis type     Rx / DC Orders   ED Discharge Orders     None        Note:  This document was prepared using Dragon voice recognition software and may include unintentional dictation errors.   Keland Peyton, Layla Maw, DO 05/18/23 [phone]

## 2023-05-18 NOTE — ED Notes (Signed)
Pt ambulatory to restroom

## 2023-05-18 NOTE — ED Notes (Signed)
Received pt from night shift  Resting at present   Admitting MD at bedside

## 2023-05-18 NOTE — Op Note (Signed)
Laparoscopic Appendectomy  Pre-operative Diagnosis: Acute appendicitis  Post-operative Diagnosis: Same  Procedure: Laparoscopic appendectomy  Surgeon: Baker Pierini, MD  Anesthesia: Gen. with endotracheal tube  Assistant: None   Findings: Acutely inflamed appendix without evidence of perforation Estimated Blood Loss: 5         Drains: None         Specimens: 6       Complications: none   Procedure Details  The patient was seen again in the Holding Room. The benefits, complications, treatment options, and expected outcomes were discussed with the patient.  The patient was then brought to the operating room placed supine on the operating room table. Prior to the induction of general anesthesia, antibiotic prophylaxis was administered. VTE prophylaxis was in place. General endotracheal anesthesia was then administered and tolerated well. After the induction, the abdomen was prepped with Chloraprep and draped in the sterile fashion. The patient was positioned in the supine position.  Using standard drop technique a veress needle was inserted into the abdomen in the Left upper quadrant. Pneumoperitoneum was then created with CO2 and tolerated well without any adverse changes in the patient's vital signs.  A 5mm infra-umbilical port was inserted using an optiveiew trocar. There were no injuries noted at site of veress insertion.  A 12 mm left lower quadrant and 5 mm suprapubic port was then placed under direct visualization.  The patient was positioned with headdown right side up.  The small bowel was swept out of the pelvis.  The appendix was identified.  The mesoappendix was inflamed and the appendix was also inflamed.  There was no evidence of perforation.  The mesoappendix was grasped and elevated to the anterior abdominal wall.  The base of the appendix was identified.  A window at the base of the appendix was made.  The appendix was then stapled off using a 45 mm Endo GIA blue load  stapler.  The mesoappendix was then taken with a 45 mm Endo GIA white load stapler.  The appendix was then placed in Endo Catch bag.  The right lower quadrant was then suctioned out of small amount of blood.  The staple line was examined and appeared to be intact.  Stasis was then obtained.  The appendix was then removed from the 12 mm port and passed off the table as specimen.  The 12 mm port fascia was then closed with a 0 Vicryl on a suture needle passer.  The left lower quadrant will preperitoneal space was then infiltrated with liposomal bupivacaine and Marcaine solution.  A final check revealed no ongoing bleeding or leakage of bile.  The abdomen was then allowed to be desufflated.  The skin of the incision was then infiltrated with the same liposomal bupivacaine and Marcaine solution.  The skin was then closed with 4-0 Monocryl and dressed with glue.  Prior to closure of the skin all sponge and instrument counts were correct x 2.  The patient was then awoken from general endotracheal anesthesia and transferred the PACU in good condition

## 2023-05-18 NOTE — Transfer of Care (Signed)
Immediate Anesthesia Transfer of Care Note  Patient: TAKELA GANTER  Procedure(s) Performed: APPENDECTOMY LAPAROSCOPIC (Abdomen)  Patient Location: PACU  Anesthesia Type:General  Level of Consciousness: awake, alert , and oriented  Airway & Oxygen Therapy: Patient Spontanous Breathing and Patient connected to face mask oxygen  Post-op Assessment: Report given to RN and Post -op Vital signs reviewed and stable  Post vital signs: Reviewed and stable  Last Vitals:  Vitals Value Taken Time  BP 155/62 05/18/23 1309  Temp    Pulse 74 05/18/23 1309  Resp 19 05/18/23 1309  SpO2 98 % 05/18/23 1309    Last Pain:  Vitals:   05/18/23 1051  TempSrc:   PainSc: 3          Complications: No notable events documented.

## 2023-05-19 ENCOUNTER — Encounter: Payer: Self-pay | Admitting: General Surgery

## 2023-05-19 LAB — SURGICAL PATHOLOGY

## 2023-05-20 LAB — URINE CULTURE: Culture: NO GROWTH

## 2023-05-25 NOTE — Discharge Summary (Signed)
Patient ID: KENNADEE KULIKOWSKI MRN: [id number] DOB/AGE: 1961-01-07 62 y.o.  Admit date: 05/18/2023 Discharge date: 05/25/2023   Discharge Diagnoses:  Principal Problem:   Acute appendicitis   Procedures:Laparoscopic Appendectomy  Hospital Course:   admitted with findings consistent with acute appendicits and  was taken promptly to the operating room for an uneventful laparoscopic appendectomy.  Patient was kept overnight.  The time of discharge the patient was ambulating,  pain was controlled.  Her vital signs were stable and she was afebrile.   physical exam at discharge showed a pt  in no acute distress.  Awake and alert.  Abdomen: Soft incisions healing well without infection or peritonitis.  Extremities well-perfused and no edema.  Condition of the patient the time of discharge was stable   Consults: None  Disposition: Discharge disposition: 01-Home or Self Care       Discharge Instructions     Call MD for:  difficulty breathing, headache or visual disturbances   Complete by: As directed    Call MD for:  extreme fatigue   Complete by: As directed    Call MD for:  hives   Complete by: As directed    Call MD for:  persistant dizziness or light-headedness   Complete by: As directed    Call MD for:  persistant nausea and vomiting   Complete by: As directed    Call MD for:  redness, tenderness, or signs of infection (pain, swelling, redness, odor or green/yellow discharge around incision site)   Complete by: As directed    Call MD for:  severe uncontrolled pain   Complete by: As directed    Call MD for:  temperature >100.4   Complete by: As directed    Diet - low sodium heart healthy   Complete by: As directed    Discharge instructions   Complete by: As directed    You have surgical glue over your incisions.  The glue will peel off over the next 2 to 4 weeks.  You may shower on postoperative day 1.  Please let the warm water run over your incisions and pat dry.  Please  refrain from submerging the wound for 2 weeks.  No heavy lifting greater than 10 to 15 pounds for 4 weeks.  You may use Tylenol and ibuprofen as needed for pain and then I will prescribe you narcotics for breakthrough pain.  Please follow-up with [place] in the office in 2 weeks   Increase activity slowly   Complete by: As directed       Allergies as of 05/18/2023       Reactions   No Known Allergies         Medication List     TAKE these medications    acetaminophen 325 MG tablet Commonly known as: TYLENOL Take 2 tablets (650 mg total) by mouth every 6 (six) hours as needed for mild pain (or Fever >/= 101).   albuterol 108 (90 Base) MCG/ACT inhaler Commonly known as: VENTOLIN HFA Inhale 2 puffs into the lungs every 6 (six) hours as needed.   amLODipine 5 MG tablet Commonly known as: NORVASC Take 5 mg by mouth daily.   apixaban 2.5 MG Tabs tablet Commonly known as: ELIQUIS 1 tablet twice a day to prevent blood clots.  When finished taking this medication start taking 81 mg aspirin daily   celecoxib 200 MG capsule Commonly known as: CELEBREX 1 tab po q day with food for pain and  swelling  docusate sodium 100 MG capsule Commonly known as: COLACE 1 tab 2 times a day while on narcotics.  STOOL SOFTENER   hydrochlorothiazide 25 MG tablet Commonly known as: HYDRODIURIL Take 25 mg by mouth daily.   omeprazole 40 MG capsule Commonly known as: PRILOSEC Take 40 mg by mouth daily.   oxyCODONE 5 MG immediate release tablet Commonly known as: Oxy IR/ROXICODONE Take 1 tablet (5 mg total) by mouth every 6 (six) hours as needed for severe pain (pain score 7-10). What changed:  how much to take how to take this when to take this reasons to take this additional instructions Another medication with the same name was removed. Continue taking this medication, and follow the directions you see here.   polyethylene glycol 17 g packet Commonly known as: MIRALAX / GLYCOLAX 17grams  in 16 oz of water twice a day until bowel movement.  LAXITIVE.  Restart if two days since last bowel movement   Xiidra 5 % Soln Generic drug: Lifitegrast Place 1 drop into both eyes daily as needed (dry eye).        Follow-up Information     Kandis Cocking, MD Follow up on 06/03/2023.   Specialty: General Surgery Why: 9:30 am Contact information: [address] [place] [address] [phone]                  Baker Pierini, M.D.

## 2023-06-03 ENCOUNTER — Encounter: Payer: Self-pay | Admitting: General Surgery

## 2023-06-03 ENCOUNTER — Ambulatory Visit (INDEPENDENT_AMBULATORY_CARE_PROVIDER_SITE_OTHER): Payer: BC Managed Care – PPO | Admitting: General Surgery

## 2023-06-03 VITALS — BP 127/76 | HR 75 | Temp 98.0°F | Ht 62.0 in | Wt 211.0 lb

## 2023-06-03 DIAGNOSIS — K352 Acute appendicitis with generalized peritonitis, without perforation or abscess: Secondary | ICD-10-CM

## 2023-06-03 DIAGNOSIS — Z09 Encounter for follow-up examination after completed treatment for conditions other than malignant neoplasm: Secondary | ICD-10-CM

## 2023-06-03 DIAGNOSIS — K3580 Unspecified acute appendicitis: Secondary | ICD-10-CM

## 2023-06-03 NOTE — Patient Instructions (Signed)

## 2023-06-03 NOTE — Progress Notes (Signed)
Evelyn Mcintosh returns today status post laparoscopic appendectomy on 10 December.  She reports doing well.  She initially had some constipation that was resolved with over-the-counter laxatives.  She also had a bout of bronchitis with coughing that did exacerbate the pain.  Now, however, she is tolerating a regular diet and having normal bowel function.  Her pain is resolved and she denies any erythema or drainage from the wounds.  On exam her abdomen is soft, nontender and nondistended.  Her laparoscopic port sites are healing well.  In the left lower quadrant there is still small amount of glue over the large incision.  I discussed the pathology with her consistent with acute appendicitis.  She should continue lifting restrictions no greater than 15 pounds for 2 weeks.  She can follow-up with [place] as needed

## 2023-10-06 ENCOUNTER — Other Ambulatory Visit: Payer: Self-pay | Admitting: Family Medicine

## 2023-10-06 DIAGNOSIS — Z1231 Encounter for screening mammogram for malignant neoplasm of breast: Secondary | ICD-10-CM

## 2023-10-14 ENCOUNTER — Encounter (HOSPITAL_COMMUNITY): Payer: Self-pay

## 2023-10-18 ENCOUNTER — Encounter (HOSPITAL_COMMUNITY): Payer: Self-pay

## 2023-10-21 ENCOUNTER — Ambulatory Visit
Admission: RE | Admit: 2023-10-21 | Discharge: 2023-10-21 | Disposition: A | Discharge status: Home / Self Care | Source: Ambulatory Visit | Attending: Family Medicine | Admitting: Family Medicine

## 2023-10-21 DIAGNOSIS — Z1231 Encounter for screening mammogram for malignant neoplasm of breast: Secondary | ICD-10-CM | POA: Diagnosis present

## 2023-10-25 ENCOUNTER — Encounter
Admission: RE | Disposition: A | Discharge status: Home / Self Care | Payer: Self-pay | Source: Home / Self Care | Attending: Internal Medicine

## 2023-10-25 ENCOUNTER — Encounter: Payer: Self-pay | Admitting: *Deleted

## 2023-10-25 ENCOUNTER — Ambulatory Visit: Admitting: Anesthesiology

## 2023-10-25 ENCOUNTER — Other Ambulatory Visit: Payer: Self-pay

## 2023-10-25 ENCOUNTER — Observation Stay
Admission: RE | Admit: 2023-10-25 | Discharge: 2023-10-26 | Disposition: A | Discharge status: Home / Self Care | Payer: BC Managed Care – PPO | Attending: Internal Medicine | Admitting: Internal Medicine

## 2023-10-25 DIAGNOSIS — I10 Essential (primary) hypertension: Secondary | ICD-10-CM | POA: Insufficient documentation

## 2023-10-25 DIAGNOSIS — K6289 Other specified diseases of anus and rectum: Secondary | ICD-10-CM | POA: Insufficient documentation

## 2023-10-25 DIAGNOSIS — K317 Polyp of stomach and duodenum: Secondary | ICD-10-CM | POA: Diagnosis not present

## 2023-10-25 DIAGNOSIS — K640 First degree hemorrhoids: Secondary | ICD-10-CM | POA: Insufficient documentation

## 2023-10-25 DIAGNOSIS — K219 Gastro-esophageal reflux disease without esophagitis: Secondary | ICD-10-CM | POA: Insufficient documentation

## 2023-10-25 DIAGNOSIS — Z96652 Presence of left artificial knee joint: Secondary | ICD-10-CM | POA: Insufficient documentation

## 2023-10-25 DIAGNOSIS — I480 Paroxysmal atrial fibrillation: Principal | ICD-10-CM | POA: Insufficient documentation

## 2023-10-25 DIAGNOSIS — Z1231 Encounter for screening mammogram for malignant neoplasm of breast: Secondary | ICD-10-CM | POA: Diagnosis not present

## 2023-10-25 DIAGNOSIS — I4891 Unspecified atrial fibrillation: Secondary | ICD-10-CM | POA: Diagnosis present

## 2023-10-25 DIAGNOSIS — D123 Benign neoplasm of transverse colon: Secondary | ICD-10-CM | POA: Insufficient documentation

## 2023-10-25 DIAGNOSIS — Z79899 Other long term (current) drug therapy: Secondary | ICD-10-CM | POA: Diagnosis not present

## 2023-10-25 DIAGNOSIS — Z1211 Encounter for screening for malignant neoplasm of colon: Secondary | ICD-10-CM | POA: Diagnosis not present

## 2023-10-25 DIAGNOSIS — E66812 Obesity, class 2: Secondary | ICD-10-CM | POA: Insufficient documentation

## 2023-10-25 DIAGNOSIS — R7989 Other specified abnormal findings of blood chemistry: Secondary | ICD-10-CM | POA: Insufficient documentation

## 2023-10-25 DIAGNOSIS — E119 Type 2 diabetes mellitus without complications: Secondary | ICD-10-CM | POA: Insufficient documentation

## 2023-10-25 DIAGNOSIS — J45909 Unspecified asthma, uncomplicated: Secondary | ICD-10-CM | POA: Diagnosis not present

## 2023-10-25 DIAGNOSIS — Z6838 Body mass index (BMI) 38.0-38.9, adult: Secondary | ICD-10-CM | POA: Insufficient documentation

## 2023-10-25 DIAGNOSIS — E669 Obesity, unspecified: Secondary | ICD-10-CM | POA: Insufficient documentation

## 2023-10-25 HISTORY — PX: COLONOSCOPY WITH PROPOFOL: SHX5780

## 2023-10-25 HISTORY — PX: POLYPECTOMY: SHX149

## 2023-10-25 HISTORY — PX: ESOPHAGOGASTRODUODENOSCOPY (EGD) WITH PROPOFOL: SHX5813

## 2023-10-25 HISTORY — DX: Type 2 diabetes mellitus without complications: E11.9

## 2023-10-25 LAB — TROPONIN I (HIGH SENSITIVITY)
Troponin I (High Sensitivity): 40 ng/L — ABNORMAL HIGH (ref <18)
Troponin I (High Sensitivity): 45 ng/L — ABNORMAL HIGH (ref <18)

## 2023-10-25 LAB — CBC
HCT: 40 % (ref 36.0–46.0)
Hemoglobin: 13.6 g/dL (ref 12.0–15.0)
MCH: 29.6 pg (ref 26.0–34.0)
MCHC: 34 g/dL (ref 30.0–36.0)
MCV: 87.1 fL (ref 80.0–100.0)
Platelets: 320 10*3/uL (ref 150–400)
RBC: 4.59 MIL/uL (ref 3.87–5.11)
RDW: 13.3 % (ref 11.5–15.5)
WBC: 5.6 10*3/uL (ref 4.0–10.5)
nRBC: 0 % (ref 0.0–0.2)

## 2023-10-25 LAB — CREATININE, SERUM
Creatinine, Ser: 0.7 mg/dL (ref 0.44–1.00)
GFR, Estimated: >60 mL/min (ref >60)

## 2023-10-25 LAB — GLUCOSE, CAPILLARY: Glucose-Capillary: 90 mg/dL (ref 70–99)

## 2023-10-25 SURGERY — COLONOSCOPY WITH PROPOFOL
Anesthesia: General

## 2023-10-25 MED ORDER — SODIUM CHLORIDE 0.9 % IV SOLN
INTRAVENOUS | Status: DC
  Administered 2023-10-25: 20 mL/h via INTRAVENOUS

## 2023-10-25 MED ORDER — PROPOFOL 10 MG/ML IV BOLUS
INTRAVENOUS | Status: AC
Start: 2023-10-25 — End: ?
  Filled 2023-10-25: qty 40

## 2023-10-25 MED ORDER — GUAIFENESIN-DM 100-10 MG/5ML PO SYRP
5.0000 mL | ORAL_SOLUTION | ORAL | Status: DC | PRN
  Administered 2023-10-25 – 2023-10-26 (×2): 5 mL via ORAL
  Filled 2023-10-25 (×2): qty 10

## 2023-10-25 MED ORDER — AMLODIPINE BESYLATE 5 MG PO TABS
5.0000 mg | ORAL_TABLET | Freq: Every day | ORAL | Status: DC
  Administered 2023-10-26: 5 mg via ORAL
  Filled 2023-10-25: qty 1

## 2023-10-25 MED ORDER — LORATADINE 10 MG PO TABS
10.0000 mg | ORAL_TABLET | Freq: Every day | ORAL | Status: DC | PRN
  Administered 2023-10-25: 10 mg via ORAL
  Filled 2023-10-25 (×2): qty 1

## 2023-10-25 MED ORDER — GABAPENTIN 300 MG PO CAPS
300.0000 mg | ORAL_CAPSULE | ORAL | Status: DC
  Administered 2023-10-26: 300 mg via ORAL
  Filled 2023-10-25: qty 1

## 2023-10-25 MED ORDER — LIDOCAINE HCL (CARDIAC) PF 100 MG/5ML IV SOSY
PREFILLED_SYRINGE | INTRAVENOUS | Status: DC | PRN
  Administered 2023-10-25: 100 mg via INTRAVENOUS

## 2023-10-25 MED ORDER — LIDOCAINE HCL (PF) 2 % IJ SOLN
INTRAMUSCULAR | Status: AC
  Filled 2023-10-25: qty 5

## 2023-10-25 MED ORDER — METOPROLOL SUCCINATE ER 25 MG PO TB24
25.0000 mg | ORAL_TABLET | Freq: Every day | ORAL | Status: DC
  Administered 2023-10-25 – 2023-10-26 (×2): 25 mg via ORAL
  Filled 2023-10-25 (×2): qty 1

## 2023-10-25 MED ORDER — PROPOFOL 10 MG/ML IV BOLUS
INTRAVENOUS | Status: DC | PRN
  Administered 2023-10-25: 50 mg via INTRAVENOUS
  Administered 2023-10-25: 100 mg via INTRAVENOUS
  Administered 2023-10-25 (×3): 50 mg via INTRAVENOUS

## 2023-10-25 MED ORDER — PANTOPRAZOLE SODIUM 40 MG PO TBEC
40.0000 mg | DELAYED_RELEASE_TABLET | Freq: Every day | ORAL | Status: DC
  Administered 2023-10-25 – 2023-10-26 (×2): 40 mg via ORAL
  Filled 2023-10-25 (×2): qty 1

## 2023-10-25 MED ORDER — DEXMEDETOMIDINE HCL IN NACL 80 MCG/20ML IV SOLN
INTRAVENOUS | Status: DC | PRN
  Administered 2023-10-25: 12 ug via INTRAVENOUS

## 2023-10-25 MED ORDER — ENOXAPARIN SODIUM 60 MG/0.6ML IJ SOSY
45.0000 mg | PREFILLED_SYRINGE | INTRAMUSCULAR | Status: DC
  Administered 2023-10-25: 45 mg via SUBCUTANEOUS
  Filled 2023-10-25: qty 0.6

## 2023-10-25 MED ORDER — ALBUTEROL SULFATE (2.5 MG/3ML) 0.083% IN NEBU: 3.0000 mL | INHALATION_SOLUTION | Freq: Four times a day (QID) | RESPIRATORY_TRACT | Status: DC | PRN

## 2023-10-25 MED ORDER — ATORVASTATIN CALCIUM 20 MG PO TABS
20.0000 mg | ORAL_TABLET | ORAL | Status: DC
  Administered 2023-10-26: 20 mg via ORAL
  Filled 2023-10-25: qty 1

## 2023-10-25 MED ORDER — GLYCOPYRROLATE 0.2 MG/ML IJ SOLN
INTRAMUSCULAR | Status: DC | PRN
  Administered 2023-10-25: .2 mg via INTRAVENOUS

## 2023-10-25 NOTE — Transfer of Care (Signed)
 Immediate Anesthesia Transfer of Care Note  Patient: Evelyn Mcintosh  Procedure(s) Performed: COLONOSCOPY WITH PROPOFOL  ESOPHAGOGASTRODUODENOSCOPY (EGD) WITH PROPOFOL  POLYPECTOMY, INTESTINE  Patient Location: PACU  Anesthesia Type:General  Level of Consciousness: awake, alert , oriented, and patient cooperative  Airway & Oxygen Therapy: Patient Spontanous Breathing  Post-op Assessment: Report given to RN and Post -op Vital signs reviewed and stable  Post vital signs: Reviewed and stable  Last Vitals:  Vitals Value Taken Time  BP 124/61 10/25/23 0906  Temp    Pulse 98 10/25/23 0907  Resp 16 10/25/23 0907  SpO2 97 % 10/25/23 0907  Vitals shown include unfiled device data.  Last Pain:  Vitals:   10/25/23 0730  TempSrc: Temporal  PainSc: 0-No pain         Complications: No notable events documented.

## 2023-10-25 NOTE — Op Note (Signed)
 [facility] Patient Name: Evelyn Mcintosh Procedure Date: 10/25/2023 8:30 AM MRN: [id number] Account #: [id number] Date of Birth: [birth year] Admit Type: Outpatient Age: 63 Room: [facility] ENDO ROOM 3 Gender: Female Note Status: Finalized Instrument Name: Charlyn Cooley [id number] Procedure:             Colonoscopy Indications:           Screening for colorectal malignant neoplasm Providers:             Leida Puna MD, MD Referring MD:          Leida Puna MD, MD (Referring MD) Medicines:             Monitored Anesthesia Care Complications:         No immediate complications. Estimated blood loss:                         Minimal. Procedure:             Pre-Anesthesia Assessment:                        - Prior to the procedure, a History and Physical was                         performed, and patient medications and allergies were                         reviewed. The patient is competent. The risks and                         benefits of the procedure and the sedation options and                         risks were discussed with the patient. All questions                         were answered and informed consent was obtained.                         Patient identification and proposed procedure were                         verified by the physician, the nurse, the                         anesthesiologist, the anesthetist and the technician                         in the endoscopy suite. Mental Status Examination:                         alert and oriented. Airway Examination: normal                         oropharyngeal airway and neck mobility. Respiratory                         Examination: clear to auscultation. CV Examination:  normal. Prophylactic Antibiotics: The patient does not                         require prophylactic antibiotics. Prior                         Anticoagulants: The patient has taken no anticoagulant                          or antiplatelet agents. ASA Grade Assessment: III - A                         patient with severe systemic disease. After reviewing                         the risks and benefits, the patient was deemed in                         satisfactory condition to undergo the procedure. The                         anesthesia plan was to use monitored anesthesia care                         (MAC). Immediately prior to administration of                         medications, the patient was re-assessed for adequacy                         to receive sedatives. The heart rate, respiratory                         rate, oxygen saturations, blood pressure, adequacy of                         pulmonary ventilation, and response to care were                         monitored throughout the procedure. The physical                         status of the patient was re-assessed after the                         procedure.                        After obtaining informed consent, the colonoscope was                         passed under direct vision. Throughout the procedure,                         the patient's blood pressure, pulse, and oxygen                         saturations were monitored continuously. The  Colonoscope was introduced through the anus and                         advanced to the the cecum, identified by appendiceal                         orifice and ileocecal valve. The colonoscopy was                         performed without difficulty. The patient tolerated                         the procedure well. The quality of the bowel                         preparation was good except the ascending colon was                         fair. The ileocecal valve, appendiceal orifice, and                         rectum were photographed. Findings:      The perianal and digital rectal examinations were normal.      A 3 mm polyp was found in the proximal  transverse colon. The polyp was       sessile. The polyp was removed with a cold snare. Resection and       retrieval were complete. Estimated blood loss was minimal.      Internal hemorrhoids were found during retroflexion. The hemorrhoids       were Grade I (internal hemorrhoids that do not prolapse).      Anal papilla(e) were hypertrophied.      The exam was otherwise without abnormality on direct and retroflexion       views. Impression:            - One 3 mm polyp in the proximal transverse colon,                         removed with a cold snare. Resected and retrieved.                        - Internal hemorrhoids.                        - Anal papilla(e) were hypertrophied.                        - The examination was otherwise normal on direct and                         retroflexion views. Recommendation:        - Discharge patient to home.                        - Resume previous diet.                        - Continue present medications.                        -  Await pathology results.                        - Repeat colonoscopy in 3 years because the bowel                         preparation was suboptimal.                        - Return to referring physician as previously                         scheduled. Procedure Code(s):     --- Professional ---                        [phone], Colonoscopy, flexible; with removal of                         tumor(s), polyp(s), or other lesion(s) by snare                         technique Diagnosis Code(s):     --- Professional ---                        Z12.11, Encounter for screening for malignant neoplasm                         of colon                        D12.3, Benign neoplasm of transverse colon (hepatic                         flexure or splenic flexure)                        K62.89, Other specified diseases of anus and rectum                        K64.0, First degree hemorrhoids CPT copyright 2022 American Medical  Association. All rights reserved. The codes documented in this report are preliminary and upon coder review may  be revised to meet current compliance requirements. Leida Puna MD, MD 10/25/2023 9:07:15 AM Number of Addenda: 0 Note Initiated On: 10/25/2023 8:30 AM Scope Withdrawal Time: 0 hours 14 minutes 21 seconds  Total Procedure Duration: 0 hours 19 minutes 55 seconds  Estimated Blood Loss:  Estimated blood loss was minimal.      [facility]

## 2023-10-25 NOTE — Op Note (Addendum)
 [facility] Patient Name: Evelyn Mcintosh Procedure Date: 10/25/2023 8:30 AM MRN: [id number] Account #: [id number] Date of Birth: 04-28-1961 Admit Type: Outpatient Age: 63 Room: [facility] ENDO ROOM 3 Gender: Female Note Status: Supervisor Override Instrument Name: Almyra Jain [id number] Procedure:             Upper GI endoscopy Indications:           For endoscopic therapy of Barrett's esophagus without                         dysplasia Providers:             Leida Puna MD, MD Referring MD:          Leida Puna MD, MD (Referring MD) Medicines:             Monitored Anesthesia Care Complications:         No immediate complications. Procedure:             Pre-Anesthesia Assessment:                        - Prior to the procedure, a History and Physical was                         performed, and patient medications and allergies were                         reviewed. The patient is competent. The risks and                         benefits of the procedure and the sedation options and                         risks were discussed with the patient. All questions                         were answered and informed consent was obtained.                         Patient identification and proposed procedure were                         verified by the physician, the nurse, the                         anesthesiologist, the anesthetist and the technician                         in the endoscopy suite. Mental Status Examination:                         alert and oriented. Airway Examination: normal                         oropharyngeal airway and neck mobility. Respiratory                         Examination: clear to auscultation. CV Examination:  normal. Prophylactic Antibiotics: The patient does not                         require prophylactic antibiotics. Prior                         Anticoagulants: The patient has taken no  anticoagulant                         or antiplatelet agents. ASA Grade Assessment: III - A                         patient with severe systemic disease. After reviewing                         the risks and benefits, the patient was deemed in                         satisfactory condition to undergo the procedure. The                         anesthesia plan was to use monitored anesthesia care                         (MAC). Immediately prior to administration of                         medications, the patient was re-assessed for adequacy                         to receive sedatives. The heart rate, respiratory                         rate, oxygen saturations, blood pressure, adequacy of                         pulmonary ventilation, and response to care were                         monitored throughout the procedure. The physical                         status of the patient was re-assessed after the                         procedure.                        After obtaining informed consent, the endoscope was                         passed under direct vision. Throughout the procedure,                         the patient's blood pressure, pulse, and oxygen                         saturations were monitored continuously. The Endoscope  was introduced through the mouth, and advanced to the                         second part of duodenum. The upper GI endoscopy was                         accomplished without difficulty. The patient tolerated                         the procedure well. Findings:      The esophagus and gastroesophageal junction were examined with white       light and narrow band imaging (NBI). There was no visual evidence of       Barrett's esophagus.      Multiple 4 to 10 mm sessile fundic gland polyps with no stigmata of       recent bleeding were found in the gastric fundus and in the gastric body.      The exam of the stomach was otherwise  normal.      The examined duodenum was normal. Impression:            - There is no endoscopic evidence of Barrett's                         esophagus.                        - Multiple fundic gland polyps.                        - Normal examined duodenum.                        - No specimens collected. Recommendation:        - Discharge patient to home.                        - Resume previous diet.                        - Continue present medications.                        - Return to referring physician as previously                         scheduled. Procedure Code(s):     --- Professional ---                        [phone], Esophagogastroduodenoscopy, flexible,                         transoral; diagnostic, including collection of                         specimen(s) by brushing or washing, when performed                         (separate procedure) Diagnosis Code(s):     --- Professional ---  K31.7, Polyp of stomach and duodenum                        K21.9, Gastro-esophageal reflux disease without                         esophagitis CPT copyright 2022 American Medical Association. All rights reserved. The codes documented in this report are preliminary and upon coder review may  be revised to meet current compliance requirements. Leida Puna MD, MD 10/25/2023 9:04:28 AM Number of Addenda: 0 Note Initiated On: 10/25/2023 8:30 AM Estimated Blood Loss:  Estimated blood loss: none.      [facility]

## 2023-10-25 NOTE — Plan of Care (Signed)
   Problem: Education: Goal: Knowledge of General Education information will improve Description: Including pain rating scale, medication(s)/side effects and non-pharmacologic comfort measures Outcome: Progressing   Problem: Clinical Measurements: Goal: Diagnostic test results will improve Outcome: Progressing   Problem: Nutrition: Goal: Adequate nutrition will be maintained Outcome: Progressing

## 2023-10-25 NOTE — Anesthesia Postprocedure Evaluation (Signed)
 Anesthesia Post Note  Patient: Evelyn Mcintosh  Procedure(s) Performed: COLONOSCOPY WITH PROPOFOL  ESOPHAGOGASTRODUODENOSCOPY (EGD) WITH PROPOFOL  POLYPECTOMY, INTESTINE  Patient location during evaluation: PACU Anesthesia Type: General Level of consciousness: awake and alert Pain management: pain level controlled Vital Signs Assessment: post-procedure vital signs reviewed and stable Respiratory status: spontaneous breathing, nonlabored ventilation, respiratory function stable and patient connected to nasal cannula oxygen Cardiovascular status: blood pressure returned to baseline and stable Postop Assessment: no apparent nausea or vomiting Anesthetic complications: no   No notable events documented.   Last Vitals:  Vitals:   10/25/23 0907 10/25/23 0916  BP: 124/61 (!) 131/58  Pulse:    Resp:    Temp: 36.4 C   SpO2:      Last Pain:  Vitals:   10/25/23 0907  TempSrc: Temporal  PainSc:                  Zula Hitch

## 2023-10-25 NOTE — H&P (Signed)
 Outpatient short stay form Pre-procedure 10/25/2023  Evelyn Darling, MD  Primary Physician: Arno Lapidus, MD  Reason for visit:  GERD/Screening colonoscopy  History of present illness:    63 y/o lady with history of hypertension, obesity, and arthritis here for EGD/Colonoscopy for GERD and screening colonoscopy. Last colonoscopy in 2012 was normal. No blood thinners. No family history of GI malignancies. History of appendectomy.    Current Facility-Administered Medications:    0.9 %  sodium chloride  infusion, , Intravenous, Continuous, Ellizabeth Dacruz, Leanora Prophet, MD, Last Rate: 20 mL/hr at 10/25/23 0751, 20 mL/hr at 10/25/23 0751  Medications Prior to Admission  Medication Sig Dispense Refill Last Dose/Taking   acetaminophen  (TYLENOL ) 325 MG tablet Take 2 tablets (650 mg total) by mouth every 6 (six) hours as needed for mild pain (or Fever >/= 101).   Past Week   albuterol  (PROVENTIL  HFA;VENTOLIN  HFA) 108 (90 Base) MCG/ACT inhaler Inhale 2 puffs into the lungs every 6 (six) hours as needed.   Past Week   amLODipine  (NORVASC ) 5 MG tablet Take 5 mg by mouth daily.   10/25/2023 at  6:15 AM   atorvastatin  (LIPITOR) 20 MG tablet Take 1 tablet by mouth every other day.   Past Week   Cholecalciferol 75 MCG (3000 UT) TABS Take 3,000 Units by mouth daily.   Past Week   docusate sodium  (COLACE) 100 MG capsule 1 tab 2 times a day while on narcotics.  STOOL SOFTENER 60 capsule 0 Past Week   gabapentin  (NEURONTIN ) 300 MG capsule Take 300 mg by mouth every other day.   Past Week   hydrochlorothiazide (HYDRODIURIL) 25 MG tablet Take 25 mg by mouth daily.   10/25/2023 at  6:15 AM   omeprazole (PRILOSEC) 40 MG capsule Take 40 mg by mouth daily.   Past Week   polyethylene glycol (MIRALAX  / GLYCOLAX ) packet 17grams in 16 oz of water twice a day until bowel movement.  LAXITIVE.  Restart if two days since last bowel movement (Patient taking differently: Take 17 g by mouth daily as needed. 17grams in 16 oz of  water twice a day until bowel movement.  LAXITIVE.  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    Physical Exam  Gen: Alert, oriented. Appears stated age.  HEENT: PERRLA. Lungs: No respiratory distress CV: RRR Abd: soft, benign, no masses Ext: No edema    Planned procedures: Proceed with EGD/colonoscopy. The patient understands the nature of the planned procedure, indications, risks, alternatives and potential complications including but not limited to bleeding, infection, perforation, damage to internal organs and possible oversedation/side effects from anesthesia. The patient agrees and gives consent to proceed.  Please refer to procedure notes for findings, recommendations and patient disposition/instructions.     Evelyn Darling, MD [facility]

## 2023-10-25 NOTE — OR Nursing (Signed)
 Patient's heart rhythm showed Afib on the monitor a few minutes after coming to endo recovery area. CRNA was notified as there were no issues during the procedure. Dr. Welton Hall of anesthesia was also notified and gave an order for 12 lead EKG. EKG also showed afib. Dr. Welton Hall requested to have hospitalist see patient prior to discharge. Patient placement RN was contacted to have hospitalist consult on patient. Dr. Welton Hall ascom telephone number was provided for hospitalist. Patient is currently awaiting hospitalist to round. Husband is also present at the bedside.

## 2023-10-25 NOTE — H&P (Signed)
 History and Physical    Patient: Evelyn Mcintosh:[id number] DOB: 1960-12-04 DOA: 10/25/2023 DOS: the patient was seen and examined on 10/25/2023 PCP: Arno Lapidus, MD  Patient coming from: Home  Chief Complaint: No chief complaint on file.  HPI: Evelyn Mcintosh is a 64 y.o. female with medical history significant of essential hypertension, dyslipidemia, gastroesophageal reflux disease, diet-controlled diabetes, asthma, who presents to the hospital for colonoscopy/EGD.  During the procedure, patient developed atrial fibrillation with RVR, in the meantime, patient also had some palpitation and chest pain.  Chest pain localized in the mid chest, moderate in severity, lasted about 2 or 3 minutes.  No short of breath. I was called to admit patient for observation overnight. Review of Systems: As mentioned in the history of present illness. All other systems reviewed and are negative. 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   Past Surgical History:  Procedure Laterality Date   APPENDECTOMY     BREAST BIOPSY Left 2013   benign   BREAST SURGERY Left    BENIGN LUMPS   COLONOSCOPY     KNEE ARTHROSCOPY W/ MENISCECTOMY Left 05/24/2009   LAPAROSCOPIC APPENDECTOMY N/A 05/18/2023   Procedure: APPENDECTOMY LAPAROSCOPIC;  Surgeon: Barrett Lick, MD;  Location: [facility] ORS;  Service: General;  Laterality: N/A;   TOTAL KNEE ARTHROPLASTY Left 08/20/2014   Procedure: TOTAL KNEE ARTHROPLASTY;  Surgeon: Elly Habermann, MD;  Location: [facility] OR;  Service: Orthopedics;  Laterality: Left;   TOTAL KNEE ARTHROPLASTY Right 01/27/2016   TOTAL KNEE ARTHROPLASTY Right 01/27/2016   Procedure: RIGHT TOTAL KNEE  ARTHROPLASTY;  Surgeon: Elly Habermann, MD;  Location: [facility] OR;  Service: Orthopedics;  Laterality: Right;   UPPER GI ENDOSCOPY     x 2   Social History:  reports that she has never smoked. She has never used smokeless tobacco. She reports that she does not drink alcohol and does not use drugs.  Allergies  Allergen Reactions   No Known Allergies     Family History  Problem Relation Age of Onset   Atrial fibrillation Mother    Alzheimer's disease Mother    Hypertension Father    Stroke Father    Arthritis Other    Diabetes Other    Breast cancer Neg Hx     Prior to Admission medications   Medication Sig Start Date End Date Taking? Authorizing Provider  acetaminophen  (TYLENOL ) 325 MG tablet Take 2 tablets (650 mg total) by mouth every 6 (six) hours as needed for mild pain (or Fever >/= 101). 08/21/14  Yes Shepperson, Kirstin, PA-C  albuterol  (PROVENTIL  HFA;VENTOLIN  HFA) 108 (90 Base) MCG/ACT inhaler Inhale 2 puffs into the lungs every 6 (six) hours as needed. 11/26/15  Yes [provider]  amLODipine  (NORVASC ) 5 MG tablet Take 5 mg by mouth daily.   Yes [provider]  atorvastatin  (LIPITOR) 20 MG tablet Take 1 tablet by mouth every other day. 03/22/23 03/21/24 Yes [provider]  Cholecalciferol 75 MCG (3000 UT) TABS Take 3,000 Units by mouth daily.   Yes [provider]  docusate sodium  (COLACE) 100 MG capsule 1 tab 2 times a day while on narcotics.  STOOL SOFTENER 01/28/16  Yes Shepperson, Kirstin, PA-C  gabapentin  (NEURONTIN ) 300 MG capsule Take 300 mg by mouth every other day.   Yes [provider]  hydrochlorothiazide (HYDRODIURIL) 25 MG tablet Take 25 mg by mouth daily.   Yes [provider]  omeprazole (PRILOSEC) 40 MG capsule Take 40 mg by mouth daily.   Yes [provider]  polyethylene glycol (MIRALAX  / GLYCOLAX ) packet 17grams in 16 oz of water twice a day until bowel movement.  LAXITIVE.  Restart if two days since  last bowel movement Patient taking differently: Take 17 g by mouth daily as needed. 17grams in 16 oz of water twice a day until bowel movement.  LAXITIVE.  Restart if two days since last bowel movement 01/28/16  Yes Maryln Sober, [place]    Physical Exam: Vitals:   10/25/23 1610 10/25/23 1130 10/25/23 1200 10/25/23 1234  BP: 112/76 (!) 150/78 (!) 141/68 (!) 140/68  Pulse:  (!) 143  (!) 110  Resp:      Temp:      TempSrc:      SpO2:      Weight:      Height:       Physical Exam Constitutional:      General: She is not in acute distress.    Appearance: She is obese. She is not ill-appearing or toxic-appearing.  HENT:     Head: Normocephalic and atraumatic.     Nose: Nose normal. No congestion.     Mouth/Throat:     Mouth: Mucous membranes are moist.     Pharynx: Oropharynx is clear.  Eyes:     Conjunctiva/sclera: Conjunctivae normal.     Pupils: Pupils are equal, round, and reactive to light.  Cardiovascular:     Rate and Rhythm: Tachycardia present. Rhythm irregular.     Heart sounds: No murmur heard.    No gallop.  Pulmonary:     Effort: Pulmonary effort is normal. No respiratory distress.     Breath sounds: Normal breath sounds. No wheezing.  Abdominal:     General: Abdomen is flat. Bowel sounds are normal.     Palpations: Abdomen is soft.     Tenderness: There is no abdominal tenderness.  Musculoskeletal:        General: Normal range of motion.     Cervical back: Normal range of motion. No rigidity or tenderness.     Right lower leg: No edema.     Left lower leg: No edema.  Skin:    General: Skin is warm and dry.     Coloration: Skin is not jaundiced.  Neurological:     General: No focal deficit present.     Mental Status: She is alert and oriented to person, place, and time.     Cranial Nerves: No cranial nerve deficit.  Psychiatric:        Mood and Affect: Mood normal.        Thought Content: Thought content normal.     Data Reviewed:  EKG showed  atrial fibrillation with RVR, current telemetry showed sinus rhythm with frequent PACs.  Assessment and Plan: Paroxysmal atrial fibrillation with RVR. Chest pain. Patient still has some frequent PACs, otherwise has converted to sinus.  However, patient has complaining some chest pain, will check a troponin to rule out acute coronary syndrome. Patient be monitored overnight on telemetry. Added metoprolol . Patient has appointment with cardiology in 2 to 3 weeks, we will leave that to them for anticoagulation decision.  Essential hypertension. Continue some home medicines pain  Asthma. No exacerbation.  Class II obesity.  BMI 38.45. Diet and excise.     Advance Care Planning:   Code Status: Full Code patient is a full code.  Consults: None  Family Communication: Husband updated at bedside.  Severity of Illness: The appropriate patient status for this patient is OBSERVATION. Observation status is judged to be reasonable and necessary in order to provide the required intensity of service to ensure the patient's safety. The patient's presenting symptoms, physical exam findings, and initial radiographic and laboratory data in the context of their medical condition is felt to place them at decreased risk for further clinical deterioration. Furthermore, it is anticipated that the patient will be medically stable for discharge from the hospital within 2 midnights of admission.   Author: Donaciano Frizzle, MD 10/25/2023 1:06 PM  For on call review [email or web address].

## 2023-10-25 NOTE — Interval H&P Note (Signed)
 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  The various methods of treatment have been discussed with the patient and family. After consideration of risks, benefits and other options for treatment, the patient has consented to  Procedure(s): COLONOSCOPY WITH PROPOFOL  (N/A) ESOPHAGOGASTRODUODENOSCOPY (EGD) WITH PROPOFOL  (N/A) as a surgical intervention.  The patient's history has been reviewed, patient examined, no change in status, stable for surgery.  I have reviewed the patient's chart and labs.  Questions were answered to the patient's satisfaction.     Shane Darling  Ok to proceed with EGD/Colonoscopy

## 2023-10-25 NOTE — OR Nursing (Signed)
 Heart rate currently 115 on the monitor. Reported off to J. Lorrine Rotunda, Charity fundraiser. Hospitalist was messaged earlier and notified of patients location.

## 2023-10-25 NOTE — Plan of Care (Signed)

## 2023-10-25 NOTE — Anesthesia Preprocedure Evaluation (Signed)
 Anesthesia Evaluation  Patient identified by MRN, date of birth, ID band Patient awake    Reviewed: Allergy & Precautions, H&P , NPO status , Patient's Chart, lab work & pertinent test results, reviewed documented beta blocker date and time   Airway Mallampati: II   Neck ROM: full    Dental  (+) Poor Dentition   Pulmonary neg shortness of breath, asthma , neg recent URI   Pulmonary exam normal        Cardiovascular Exercise Tolerance: Good negative cardio ROS Normal cardiovascular exam Rhythm:regular Rate:Normal     Neuro/Psych negative neurological ROS  negative psych ROS   GI/Hepatic Neg liver ROS,GERD  Medicated,,  Endo/Other  negative endocrine ROSdiabetes, Well Controlled    Renal/GU negative Renal ROS  negative genitourinary   Musculoskeletal   Abdominal   Peds  Hematology negative hematology ROS (+)   Anesthesia Other Findings Past Medical History: No date: Arthritis No date: Asthma No date: Diabetes mellitus without complication (HCC) No date: GERD (gastroesophageal reflux disease) No date: History of blood transfusion 05/2014: Lumbar pain with radiation down left leg No date: Primary localized osteoarthritis of right knee 08/20/2014: S/P left total knee replacement using cement Past Surgical History: No date: APPENDECTOMY 2013: BREAST BIOPSY; Left     Comment:  benign No date: BREAST SURGERY; Left     Comment:  BENIGN LUMPS No date: COLONOSCOPY 05/24/2009: KNEE ARTHROSCOPY W/ MENISCECTOMY; Left 05/18/2023: LAPAROSCOPIC APPENDECTOMY; N/A     Comment:  Procedure: APPENDECTOMY LAPAROSCOPIC;  Surgeon: Barrett Lick, MD;  Location: [facility] ORS;  Service: General;                Laterality: N/A; 08/20/2014: TOTAL KNEE ARTHROPLASTY; Left     Comment:  Procedure: TOTAL KNEE ARTHROPLASTY;  Surgeon: Elly Habermann, MD;  Location: [facility] OR;  Service: Orthopedics;                 Laterality: Left; 01/27/2016: TOTAL KNEE ARTHROPLASTY; Right 01/27/2016: TOTAL KNEE ARTHROPLASTY; Right     Comment:  Procedure: RIGHT TOTAL KNEE ARTHROPLASTY;  Surgeon:               Elly Habermann, MD;  Location: [facility] OR;  Service:               Orthopedics;  Laterality: Right; No date: UPPER GI ENDOSCOPY     Comment:  x 2 BMI    Body Mass Index: 38.45 kg/m     Reproductive/Obstetrics negative OB ROS                             Anesthesia Physical Anesthesia Plan  ASA: 3  Anesthesia Plan: General   Post-op Pain Management:    Induction:   PONV Risk Score and Plan:   Airway Management Planned:   Additional Equipment:   Intra-op Plan:   Post-operative Plan:   Informed Consent: I have reviewed the patients History and Physical, chart, labs and discussed the procedure including the risks, benefits and alternatives for the proposed anesthesia with the patient or authorized representative who has indicated his/her understanding and acceptance.     Dental Advisory Given  Plan Discussed with: CRNA  Anesthesia Plan Comments:        Anesthesia Quick Evaluation

## 2023-10-26 ENCOUNTER — Observation Stay (HOSPITAL_BASED_OUTPATIENT_CLINIC_OR_DEPARTMENT_OTHER)
Admission: RE | Admit: 2023-10-26 | Discharge: 2023-10-26 | Disposition: A | Discharge status: Home / Self Care | Source: Home / Self Care | Attending: Internal Medicine | Admitting: Internal Medicine

## 2023-10-26 ENCOUNTER — Encounter: Payer: Self-pay | Admitting: Gastroenterology

## 2023-10-26 DIAGNOSIS — I4891 Unspecified atrial fibrillation: Secondary | ICD-10-CM | POA: Diagnosis not present

## 2023-10-26 DIAGNOSIS — E66812 Obesity, class 2: Secondary | ICD-10-CM | POA: Diagnosis not present

## 2023-10-26 DIAGNOSIS — I10 Essential (primary) hypertension: Secondary | ICD-10-CM | POA: Diagnosis not present

## 2023-10-26 DIAGNOSIS — I480 Paroxysmal atrial fibrillation: Secondary | ICD-10-CM | POA: Diagnosis not present

## 2023-10-26 LAB — ECHOCARDIOGRAM COMPLETE
AR max vel: 2.85 cm2
AV Area VTI: 3.12 cm2
AV Area mean vel: 2.85 cm2
AV Mean grad: 5.5 mmHg
AV Peak grad: 9.1 mmHg
Ao pk vel: 1.51 m/s
Area-P 1/2: 3.51 cm2
Est EF: >55
Height: 62 in
MV VTI: 0.88 cm2
S' Lateral: 2.6 cm
Weight: 3363.2 [oz_av]

## 2023-10-26 MED ORDER — METOPROLOL SUCCINATE ER 25 MG PO TB24: 25.0000 mg | ORAL_TABLET | Freq: Every day | ORAL | 0 refills | Status: AC

## 2023-10-26 NOTE — Discharge Summary (Signed)
 Physician Discharge Summary   Patient: Evelyn Mcintosh MRN: [id number] DOB: 03-12-1961  Admit date:     10/25/2023  Discharge date: 10/26/23  Discharge Physician: Donaciano Frizzle   PCP: Arno Lapidus, MD   Recommendations at discharge:   Follow-up with PCP in 1 week. Follow-up with cardiology.  Patient has scheduled cardiology follow-up on June 2 with [facility] cardiology.  Discharge Diagnoses: Principal Problem:   Atrial fibrillation with RVR (HCC) Active Problems:   GERD (gastroesophageal reflux disease)   Essential hypertension   Class 2 obesity  Resolved Problems:   * No resolved hospital problems. *  Hospital Course: Evelyn Mcintosh is a 63 y.o. female with medical history significant of essential hypertension, dyslipidemia, gastroesophageal reflux disease, diet-controlled diabetes, asthma, who presents to the hospital for colonoscopy/EGD.  During the procedure, patient developed atrial fibrillation with RVR, in the meantime, patient also had some palpitation and chest pain.  Chest pain localized in the mid chest, moderate in severity, lasted about 2 or 3 minutes.  No short of breath.   Patient was monitored overnight, has converted to sinus.  Medically stable for discharge.   Assessment and Plan: Paroxysmal atrial fibrillation with RVR. Chest pain secondary to atrial fibrillation. Minimal elevation of troponin secondary to atrial fibrillation. Patient has converted to sinus.  However, patient has complaining some chest pain, troponin at 40s, flat.  This is secondary to atrial fibrillation.  Not from acute kidney injury. Added metoprolol . Patient currently has no symptoms, echocardiogram was performed, patient does not want to wait for results.  Can follow-up with cardiology for results, she has appointment with [facility] cardiology on 6/2. Will let cardiology decide if patient need to be on anticoagulation.   Essential hypertension. Added metoprolol , discontinued amlodipine .    Asthma. No exacerbation.   Class II obesity.  BMI 38.45. Diet and excise.        Consultants: None Procedures performed: EGD/colonoscopy prior to admission  Disposition: Home Diet recommendation:  Discharge Diet Orders (From admission, onward)     Start     Ordered   10/26/23 0000  Diet - low sodium heart healthy        10/26/23 1005           Cardiac diet DISCHARGE MEDICATION: Allergies as of 10/26/2023       Reactions   No Known Allergies         Medication List     STOP taking these medications    amLODipine  5 MG tablet Commonly known as: NORVASC    hydrochlorothiazide 25 MG tablet Commonly known as: HYDRODIURIL       TAKE these medications    acetaminophen  325 MG tablet Commonly known as: TYLENOL  Take 2 tablets (650 mg total) by mouth every 6 (six) hours as needed for mild pain (or Fever >/= 101).   albuterol  108 (90 Base) MCG/ACT inhaler Commonly known as: VENTOLIN  HFA Inhale 2 puffs into the lungs every 6 (six) hours as needed.   atorvastatin  20 MG tablet Commonly known as: LIPITOR Take 1 tablet by mouth every other day.   celecoxib  100 MG capsule Commonly known as: CELEBREX  Take 100 mg by mouth daily as needed.   Cholecalciferol 75 MCG (3000 UT) Tabs Take 3,000 Units by mouth daily.   docusate sodium  100 MG capsule Commonly known as: COLACE 1 tab 2 times a day while on narcotics.  STOOL SOFTENER   fluticasone-salmeterol 250-50 MCG/ACT Aepb Commonly known as: ADVAIR Inhale 1 puff into the lungs every 12 (  twelve) hours.   gabapentin  300 MG capsule Commonly known as: NEURONTIN  Take 1 capsule by mouth at bedtime. What changed: Another medication with the same name was removed. Continue taking this medication, and follow the directions you see here.   losartan-hydrochlorothiazide 50-12.5 MG tablet Commonly known as: HYZAAR Take 1 tablet by mouth daily.   metFORMIN 500 MG 24 hr tablet Commonly known as: GLUCOPHAGE-XR Take 500  mg by mouth. With dinner   metoprolol  succinate 25 MG 24 hr tablet Commonly known as: TOPROL -XL Take 1 tablet (25 mg total) by mouth daily. Start taking on: Oct 27, 2023   montelukast 10 MG tablet Commonly known as: SINGULAIR Take 10 mg by mouth at bedtime.   omeprazole 40 MG capsule Commonly known as: PRILOSEC Take 40 mg by mouth daily.   polyethylene glycol 17 g packet Commonly known as: MIRALAX  / GLYCOLAX  17grams in 16 oz of water twice a day until bowel movement.  LAXITIVE.  Restart if two days since last bowel movement What changed:  how much to take how to take this when to take this reasons to take this   rosuvastatin 20 MG tablet Commonly known as: CRESTOR Take 20 mg by mouth daily.   tiZANidine 4 MG tablet Commonly known as: ZANAFLEX Take 2 mg by mouth 3 (three) times daily as needed for muscle spasms. And pain        Follow-up Information     Arno Lapidus, MD Follow up in 1 week(s).   Specialty: Family Medicine Why: Hospital follow up Contact information: [address] [place] [address] [phone]                Discharge Exam: Filed Weights   10/25/23 0730  Weight: 95.3 kg   General exam: Appears calm and comfortable  Respiratory system: Clear to auscultation. Respiratory effort normal. Cardiovascular system: S1 & S2 heard, RRR. No JVD, murmurs, rubs, gallops or clicks. No pedal edema. Gastrointestinal system: Abdomen is nondistended, soft and nontender. No organomegaly or masses felt. Normal bowel sounds heard. Central nervous system: Alert and oriented. No focal neurological deficits. Extremities: Symmetric 5 x 5 power. Skin: No rashes, lesions or ulcers Psychiatry: Judgement and insight appear normal. Mood & affect appropriate.    Condition at discharge: good  The results of significant diagnostics from this hospitalization (including imaging, microbiology, ancillary and laboratory) are listed below for reference.    Imaging Studies: MM 3D SCREENING MAMMOGRAM BILATERAL BREAST Result Date: 10/26/2023 CLINICAL DATA:  Screening. EXAM: DIGITAL SCREENING BILATERAL MAMMOGRAM WITH TOMOSYNTHESIS AND CAD TECHNIQUE: Bilateral screening digital craniocaudal and mediolateral oblique mammograms were obtained. Bilateral screening digital breast tomosynthesis was performed. The images were evaluated with computer-aided detection. COMPARISON:  Previous exam(s). ACR Breast Density Category c: The breasts are heterogeneously dense, which may obscure small masses. FINDINGS: There are no findings suspicious for malignancy. IMPRESSION: No mammographic evidence of malignancy. A result letter of this screening mammogram will be mailed directly to the patient. RECOMMENDATION: Screening mammogram in one year. (Code:SM-B-01Y) BI-RADS CATEGORY  1: Negative. Electronically Signed   By: Allena Ito M.D.   On: 10/26/2023 09:19    Microbiology: Results for orders placed or performed during the hospital encounter of 05/18/23  Urine Culture     Status: None   Collection Time: 05/18/23  1:10 PM   Specimen: Urine, Clean Catch  Result Value Ref Range Status   Specimen Description   Final    URINE, CLEAN CATCH Performed at [facility], [address]  [address], [place], [place] [address]    Special Requests   Final    NONE Performed at [facility], [address] [place], [place] [address]    Culture   Final    NO GROWTH Performed at [facility], [address], [place], [place] [address]    Report Status 05/20/2023 FINAL  Final    Labs: CBC: Recent Labs  Lab 10/25/23 1407  WBC 5.6  HGB 13.6  HCT 40.0  MCV 87.1  PLT 320   Basic Metabolic Panel: Recent Labs  Lab 10/25/23 1407  CREATININE 0.70   Liver Function Tests: No results for input(s): "AST", "ALT", "ALKPHOS", "BILITOT", "PROT", "ALBUMIN" in the last 168 hours. CBG: Recent Labs  Lab 10/25/23 0735  GLUCAP 90    Discharge time  spent: greater than 30 minutes.  Signed: Donaciano Frizzle, MD [facility] 10/26/2023

## 2023-10-26 NOTE — Progress Notes (Signed)
*  PRELIMINARY RESULTS* Echocardiogram 2D Echocardiogram has been performed.  [name] 10/26/2023, 10:15 AM

## 2023-10-27 LAB — MISC LABCORP TEST (SEND OUT): Labcorp test code: 83935

## 2023-10-27 LAB — SURGICAL PATHOLOGY

## 2024-02-16 ENCOUNTER — Other Ambulatory Visit: Payer: Self-pay | Admitting: Physical Medicine and Rehabilitation

## 2024-02-16 ENCOUNTER — Other Ambulatory Visit: Payer: Self-pay | Admitting: Neurosurgery

## 2024-02-16 DIAGNOSIS — M5416 Radiculopathy, lumbar region: Secondary | ICD-10-CM

## 2024-02-16 DIAGNOSIS — M545 Low back pain, unspecified: Secondary | ICD-10-CM

## 2024-02-16 DIAGNOSIS — M5412 Radiculopathy, cervical region: Secondary | ICD-10-CM

## 2024-02-19 ENCOUNTER — Other Ambulatory Visit

## 2024-02-19 ENCOUNTER — Inpatient Hospital Stay: Admission: RE | Admit: 2024-02-19 | Source: Ambulatory Visit

## 2024-02-23 ENCOUNTER — Other Ambulatory Visit

## 2024-02-24 ENCOUNTER — Inpatient Hospital Stay
Admission: RE | Admit: 2024-02-24 | Discharge: 2024-02-24 | Discharge status: Home / Self Care | Source: Ambulatory Visit | Attending: Neurosurgery | Admitting: Neurosurgery

## 2024-02-24 ENCOUNTER — Ambulatory Visit
Admission: RE | Admit: 2024-02-24 | Discharge: 2024-02-24 | Disposition: A | Discharge status: Home / Self Care | Source: Ambulatory Visit | Attending: Physical Medicine and Rehabilitation | Admitting: Physical Medicine and Rehabilitation

## 2024-02-24 DIAGNOSIS — M5416 Radiculopathy, lumbar region: Secondary | ICD-10-CM

## 2024-02-24 DIAGNOSIS — M545 Low back pain, unspecified: Secondary | ICD-10-CM

## 2024-02-24 DIAGNOSIS — M5412 Radiculopathy, cervical region: Secondary | ICD-10-CM
